# Patient Record
Sex: Female | Born: 1990
Health system: Southern US, Community
[De-identification: ages and names within clinical notes are randomized; demographics above are authoritative.]

## PROBLEM LIST (undated history)

## (undated) DIAGNOSIS — O24419 Gestational diabetes mellitus in pregnancy, unspecified control: Secondary | ICD-10-CM

## (undated) HISTORY — PX: TONSILLECTOMY: SUR1361

---

## 2018-08-05 NOTE — L&D Delivery Note (Signed)
Delivery Note  SVD viable female Apgars 8,9 over 2nd degree ML lac.  Placenta delivered spontaneously intact with 3VC. Repair with 2-0 Chromic with good support and hemostasis noted.  R/V exam confirms.  PH art was sent.   Mother and baby to couplet care and are doing well.  EBL 100cc  Maleta Pacha, MD 

## 2018-09-09 DIAGNOSIS — N911 Secondary amenorrhea: Secondary | ICD-10-CM | POA: Diagnosis not present

## 2018-09-21 DIAGNOSIS — Z319 Encounter for procreative management, unspecified: Secondary | ICD-10-CM | POA: Diagnosis not present

## 2018-09-21 DIAGNOSIS — Z3685 Encounter for antenatal screening for Streptococcus B: Secondary | ICD-10-CM | POA: Diagnosis not present

## 2018-09-21 DIAGNOSIS — Z3481 Encounter for supervision of other normal pregnancy, first trimester: Secondary | ICD-10-CM | POA: Diagnosis not present

## 2018-09-21 LAB — OB RESULTS CONSOLE ANTIBODY SCREEN: Antibody Screen: NEGATIVE

## 2018-09-21 LAB — OB RESULTS CONSOLE RPR: RPR: NONREACTIVE

## 2018-09-21 LAB — OB RESULTS CONSOLE HIV ANTIBODY (ROUTINE TESTING): HIV: NONREACTIVE

## 2018-09-21 LAB — OB RESULTS CONSOLE HEPATITIS B SURFACE ANTIGEN: Hepatitis B Surface Ag: NEGATIVE

## 2018-09-21 LAB — OB RESULTS CONSOLE ABO/RH: RH Type: POSITIVE

## 2018-09-21 LAB — OB RESULTS CONSOLE RUBELLA ANTIBODY, IGM: Rubella: IMMUNE

## 2018-09-29 DIAGNOSIS — Z113 Encounter for screening for infections with a predominantly sexual mode of transmission: Secondary | ICD-10-CM | POA: Diagnosis not present

## 2018-09-29 DIAGNOSIS — D509 Iron deficiency anemia, unspecified: Secondary | ICD-10-CM | POA: Diagnosis not present

## 2018-09-29 DIAGNOSIS — Z3400 Encounter for supervision of normal first pregnancy, unspecified trimester: Secondary | ICD-10-CM | POA: Diagnosis not present

## 2018-09-29 DIAGNOSIS — Z3480 Encounter for supervision of other normal pregnancy, unspecified trimester: Secondary | ICD-10-CM | POA: Diagnosis not present

## 2018-10-14 DIAGNOSIS — Z3682 Encounter for antenatal screening for nuchal translucency: Secondary | ICD-10-CM | POA: Diagnosis not present

## 2018-10-14 DIAGNOSIS — Z3A13 13 weeks gestation of pregnancy: Secondary | ICD-10-CM | POA: Diagnosis not present

## 2018-10-14 DIAGNOSIS — Z3401 Encounter for supervision of normal first pregnancy, first trimester: Secondary | ICD-10-CM | POA: Diagnosis not present

## 2018-11-25 DIAGNOSIS — Z3A19 19 weeks gestation of pregnancy: Secondary | ICD-10-CM | POA: Diagnosis not present

## 2018-11-25 DIAGNOSIS — Z3682 Encounter for antenatal screening for nuchal translucency: Secondary | ICD-10-CM | POA: Diagnosis not present

## 2018-11-25 DIAGNOSIS — Z363 Encounter for antenatal screening for malformations: Secondary | ICD-10-CM | POA: Diagnosis not present

## 2018-11-25 DIAGNOSIS — Z3400 Encounter for supervision of normal first pregnancy, unspecified trimester: Secondary | ICD-10-CM | POA: Diagnosis not present

## 2019-01-22 DIAGNOSIS — Z3400 Encounter for supervision of normal first pregnancy, unspecified trimester: Secondary | ICD-10-CM | POA: Diagnosis not present

## 2019-01-22 DIAGNOSIS — Z3480 Encounter for supervision of other normal pregnancy, unspecified trimester: Secondary | ICD-10-CM | POA: Diagnosis not present

## 2019-01-22 DIAGNOSIS — Z23 Encounter for immunization: Secondary | ICD-10-CM | POA: Diagnosis not present

## 2019-02-01 DIAGNOSIS — O99810 Abnormal glucose complicating pregnancy: Secondary | ICD-10-CM | POA: Diagnosis not present

## 2019-02-18 ENCOUNTER — Other Ambulatory Visit: Payer: Self-pay

## 2019-02-18 ENCOUNTER — Encounter: Payer: Self-pay | Admitting: Registered"

## 2019-02-18 ENCOUNTER — Encounter: Payer: Self-pay | Attending: Obstetrics & Gynecology | Admitting: Registered"

## 2019-02-18 DIAGNOSIS — O99810 Abnormal glucose complicating pregnancy: Secondary | ICD-10-CM | POA: Insufficient documentation

## 2019-02-18 NOTE — Progress Notes (Signed)
Patient was seen on 02/18/2019 for Gestational Diabetes self-management education at the [facility].   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 Information covered, questions answered in 45 min.  The following learning objectives were met by the patient during this course:   States the definition of Gestational Diabetes  States why dietary management is important in controlling blood glucose  Describes the effects each nutrient has on blood glucose levels  Demonstrates ability to create a balanced meal plan  Demonstrates carbohydrate counting   States when to check blood glucose levels  Demonstrates proper blood glucose monitoring techniques  States the effect of stress and exercise on blood glucose levels  States the importance of limiting caffeine and abstaining from alcohol and smoking  Blood glucose monitor given: none (Pt is [employer] Employee and will go through [facility] for monitor)  Patient instructed to monitor glucose levels: FBS: 60 - <95; 1 hour: <140; 2 hour: <120  Patient received handouts:  Nutrition Diabetes and Pregnancy, including carb counting list  Patient will be seen for follow-up as needed.

## 2019-02-19 MED FILL — FREESTYLE LITE METER: 25 days supply | Qty: 1 | Fill #0

## 2019-02-19 MED FILL — FREESTYLE LITE TEST STRIP: 25 days supply | Qty: 100 | Fill #0

## 2019-02-19 MED FILL — FREESTYLE LANCETS: 25 days supply | Qty: 100 | Fill #0

## 2019-03-05 DIAGNOSIS — Z3483 Encounter for supervision of other normal pregnancy, third trimester: Secondary | ICD-10-CM | POA: Diagnosis not present

## 2019-03-05 DIAGNOSIS — Z3482 Encounter for supervision of other normal pregnancy, second trimester: Secondary | ICD-10-CM | POA: Diagnosis not present

## 2019-03-22 DIAGNOSIS — Z3685 Encounter for antenatal screening for Streptococcus B: Secondary | ICD-10-CM | POA: Diagnosis not present

## 2019-03-26 LAB — OB RESULTS CONSOLE GBS: GBS: NEGATIVE

## 2019-04-02 ENCOUNTER — Inpatient Hospital Stay (HOSPITAL_COMMUNITY): Payer: 59 | Admitting: Anesthesiology

## 2019-04-02 ENCOUNTER — Encounter (HOSPITAL_COMMUNITY): Payer: Self-pay | Admitting: Advanced Practice Midwife

## 2019-04-02 ENCOUNTER — Inpatient Hospital Stay (HOSPITAL_COMMUNITY)
Admission: AD | Admit: 2019-04-02 | Discharge: 2019-04-04 | DRG: 807 | Disposition: A | Discharge status: Home / Self Care | Payer: 59 | Attending: Obstetrics and Gynecology | Admitting: Obstetrics and Gynecology

## 2019-04-02 ENCOUNTER — Other Ambulatory Visit: Payer: Self-pay

## 2019-04-02 DIAGNOSIS — O9902 Anemia complicating childbirth: Secondary | ICD-10-CM | POA: Diagnosis present

## 2019-04-02 DIAGNOSIS — O24420 Gestational diabetes mellitus in childbirth, diet controlled: Secondary | ICD-10-CM | POA: Diagnosis present

## 2019-04-02 DIAGNOSIS — Z20828 Contact with and (suspected) exposure to other viral communicable diseases: Secondary | ICD-10-CM | POA: Diagnosis present

## 2019-04-02 DIAGNOSIS — O24429 Gestational diabetes mellitus in childbirth, unspecified control: Secondary | ICD-10-CM | POA: Diagnosis not present

## 2019-04-02 DIAGNOSIS — Z3A37 37 weeks gestation of pregnancy: Secondary | ICD-10-CM

## 2019-04-02 DIAGNOSIS — O4292 Full-term premature rupture of membranes, unspecified as to length of time between rupture and onset of labor: Secondary | ICD-10-CM | POA: Diagnosis present

## 2019-04-02 DIAGNOSIS — D649 Anemia, unspecified: Secondary | ICD-10-CM | POA: Diagnosis present

## 2019-04-02 HISTORY — DX: Gestational diabetes mellitus in pregnancy, unspecified control: O24.419

## 2019-04-02 LAB — TYPE AND SCREEN
ABO/RH(D): A POS
Antibody Screen: NEGATIVE

## 2019-04-02 LAB — CBC
HCT: 33 % — ABNORMAL LOW (ref 36.0–46.0)
Hemoglobin: 10.9 g/dL — ABNORMAL LOW (ref 12.0–15.0)
MCH: 30.2 pg (ref 26.0–34.0)
MCHC: 33 g/dL (ref 30.0–36.0)
MCV: 91.4 fL (ref 80.0–100.0)
Platelets: 241 10*3/uL (ref 150–400)
RBC: 3.61 MIL/uL — ABNORMAL LOW (ref 3.87–5.11)
RDW: 12 % (ref 11.5–15.5)
WBC: 10.6 10*3/uL — ABNORMAL HIGH (ref 4.0–10.5)
nRBC: 0 % (ref 0.0–0.2)

## 2019-04-02 LAB — SARS CORONAVIRUS 2 BY RT PCR (HOSPITAL ORDER, PERFORMED IN ~~LOC~~ HOSPITAL LAB): SARS Coronavirus 2: NEGATIVE

## 2019-04-02 LAB — POCT FERN TEST: POCT Fern Test: POSITIVE

## 2019-04-02 LAB — GLUCOSE, CAPILLARY
Glucose-Capillary: 76 mg/dL (ref 70–99)
Glucose-Capillary: 63 mg/dL — ABNORMAL LOW (ref 70–99)
Glucose-Capillary: 78 mg/dL (ref 70–99)
Glucose-Capillary: 69 mg/dL — ABNORMAL LOW (ref 70–99)

## 2019-04-02 MED ORDER — FLEET ENEMA 7-19 GM/118ML RE ENEM: 1.0000 | ENEMA | Freq: Once | RECTAL | Status: DC

## 2019-04-02 MED ORDER — OXYCODONE-ACETAMINOPHEN 5-325 MG PO TABS: 2.0000 | ORAL_TABLET | ORAL | Status: DC | PRN

## 2019-04-02 MED ORDER — OXYTOCIN BOLUS FROM INFUSION
500.0000 mL | Freq: Once | INTRAVENOUS | Status: AC
  Administered 2019-04-03: 03:00:00 500 mL via INTRAVENOUS

## 2019-04-02 MED ORDER — PHENYLEPHRINE 40 MCG/ML (10ML) SYRINGE FOR IV PUSH (FOR BLOOD PRESSURE SUPPORT): 80.0000 ug | PREFILLED_SYRINGE | INTRAVENOUS | Status: DC | PRN

## 2019-04-02 MED ORDER — SOD CITRATE-CITRIC ACID 500-334 MG/5ML PO SOLN: 30.0000 mL | ORAL | Status: DC | PRN

## 2019-04-02 MED ORDER — EPHEDRINE 5 MG/ML INJ: 10.0000 mg | INTRAVENOUS | Status: DC | PRN

## 2019-04-02 MED ORDER — LIDOCAINE-EPINEPHRINE (PF) 2 %-1:200000 IJ SOLN
INTRAMUSCULAR | Status: DC | PRN
  Administered 2019-04-02 (×2): 3 mL via EPIDURAL
  Administered 2019-04-02: 12 mL via EPIDURAL

## 2019-04-02 MED ORDER — OXYTOCIN 40 UNITS IN NORMAL SALINE INFUSION - SIMPLE MED: 2.5000 [IU]/h | INTRAVENOUS | Status: DC

## 2019-04-02 MED ORDER — DIPHENHYDRAMINE HCL 50 MG/ML IJ SOLN: 12.5000 mg | INTRAMUSCULAR | Status: DC | PRN

## 2019-04-02 MED ORDER — ONDANSETRON HCL 4 MG/2ML IJ SOLN
4.0000 mg | Freq: Four times a day (QID) | INTRAMUSCULAR | Status: DC | PRN
  Administered 2019-04-03: 4 mg via INTRAVENOUS
  Filled 2019-04-02: qty 2

## 2019-04-02 MED ORDER — OXYTOCIN 40 UNITS IN NORMAL SALINE INFUSION - SIMPLE MED
1.0000 m[IU]/min | INTRAVENOUS | Status: DC
  Administered 2019-04-02: 2 m[IU]/min via INTRAVENOUS
  Filled 2019-04-02: qty 1000

## 2019-04-02 MED ORDER — FENTANYL-BUPIVACAINE-NACL 0.5-0.125-0.9 MG/250ML-% EP SOLN
12.0000 mL/h | EPIDURAL | Status: DC | PRN
  Filled 2019-04-02: qty 250

## 2019-04-02 MED ORDER — LACTATED RINGERS IV SOLN
500.0000 mL | Freq: Once | INTRAVENOUS | Status: AC
  Administered 2019-04-02: 16:00:00 500 mL via INTRAVENOUS

## 2019-04-02 MED ORDER — TERBUTALINE SULFATE 1 MG/ML IJ SOLN: 0.2500 mg | Freq: Once | INTRAMUSCULAR | Status: DC | PRN

## 2019-04-02 MED ORDER — LACTATED RINGERS IV SOLN: 500.0000 mL | INTRAVENOUS | Status: DC | PRN

## 2019-04-02 MED ORDER — LIDOCAINE HCL (PF) 1 % IJ SOLN: 30.0000 mL | INTRAMUSCULAR | Status: DC | PRN

## 2019-04-02 MED ORDER — LACTATED RINGERS IV SOLN
INTRAVENOUS | Status: DC
  Administered 2019-04-02 (×2): via INTRAVENOUS

## 2019-04-02 MED ORDER — ACETAMINOPHEN 325 MG PO TABS: 650.0000 mg | ORAL_TABLET | ORAL | Status: DC | PRN

## 2019-04-02 MED ORDER — OXYCODONE-ACETAMINOPHEN 5-325 MG PO TABS: 1.0000 | ORAL_TABLET | ORAL | Status: DC | PRN

## 2019-04-02 NOTE — MAU Note (Addendum)
Pt states her water broke and she called her OB and was told to come in. ([facility]). Water broke at roughly 0800, clear fluid,no odor, still feels baby move normally. At last check weds pt states she was 3cm dialated.

## 2019-04-02 NOTE — MAU Note (Signed)
Covid swab collected.PT asymptomatic. Pt tolerated well

## 2019-04-02 NOTE — Progress Notes (Signed)
From about 1020-1031 pt was up gathering clothing and moving around room. Marilynne Drivers, RN

## 2019-04-02 NOTE — MAU Note (Signed)
Pt states her BS have been about 106 she checks twice daily.

## 2019-04-02 NOTE — Anesthesia Procedure Notes (Signed)
Epidural Patient location during procedure: OB Start time: 04/02/2019 4:00 PM End time: 04/02/2019 4:15 PM  Staffing Anesthesiologist: Freddrick March, MD Performed: anesthesiologist   Preanesthetic Checklist Completed: patient identified, pre-op evaluation, timeout performed, IV checked, risks and benefits discussed and monitors and equipment checked  Epidural Patient position: sitting Prep: site prepped and draped and DuraPrep Patient monitoring: continuous pulse ox, blood pressure, heart rate and cardiac monitor Approach: midline Location: L3-L4 Injection technique: LOR air  Needle:  Needle type: Tuohy  Needle gauge: 17 G Needle length: 9 cm Needle insertion depth: 5 cm Catheter type: closed end flexible Catheter size: 19 Gauge Catheter at skin depth: 10 cm Test dose: negative  Assessment Sensory level: T8 Events: blood not aspirated, injection not painful, no injection resistance, negative IV test and no paresthesia  Additional Notes Patient identified. Risks/Benefits/Options discussed with patient including but not limited to bleeding, infection, nerve damage, paralysis, failed block, incomplete pain control, headache, blood pressure changes, nausea, vomiting, reactions to medication both or allergic, itching and postpartum back pain. Confirmed with bedside nurse the patient's most recent platelet count. Confirmed with patient that they are not currently taking any anticoagulation, have any bleeding history or any family history of bleeding disorders. Patient expressed understanding and wished to proceed. All questions were answered. Sterile technique was used throughout the entire procedure. Please see nursing notes for vital signs. Test dose was given through epidural catheter and negative prior to continuing to dose epidural or start infusion. Warning signs of high block given to the patient including shortness of breath, tingling/numbness in hands, complete motor block,  or any concerning symptoms with instructions to call for help. Patient was given instructions on fall risk and not to get out of bed. All questions and concerns addressed with instructions to call with any issues or inadequate analgesia.  Reason for block:procedure for pain

## 2019-04-02 NOTE — H&P (Signed)
Renee Nelson is a 28 y.o. female presenting for PROM at 0800 clear fluid.  Pregnancy complicated by A2QJ with great control.  GBS-. OB History    Gravida  1   Para      Term      Preterm      AB      Living        SAB      TAB      Ectopic      Multiple      Live Births             Past Medical History:  Diagnosis Date  . Gestational diabetes    managed by diet   Past Surgical History:  Procedure Laterality Date  . TONSILLECTOMY     Family History: family history is not on file. Social History:  reports that she has never smoked. She has never used smokeless tobacco. She reports that she does not drink alcohol or use drugs.     Maternal Diabetes: Yes:  Diabetes Type:  Diet controlled Genetic Screening: Normal Maternal Ultrasounds/Referrals: Normal Fetal Ultrasounds or other Referrals:  None Maternal Substance Abuse:  No Significant Maternal Medications:  None Significant Maternal Lab Results:  Group B Strep negative Other Comments:  None  ROS History Dilation: 3 Effacement (%): 70 Station: -2 Exam by:: Foley,rn Blood pressure 121/69, pulse 86, temperature 98.8 F (37.1 C), temperature source Oral, resp. rate 18, height 5\' 3"  (1.6 m), weight 74.8 kg, SpO2 98 %. Exam Physical Exam  Prenatal labs: ABO, Rh: --/--/A POS, A POS Performed at [facility], [place] [address], [place], [place] [address]  [id number]/28 1024) Antibody: NEG (08/28 1024) Rubella: Immune (02/17 0000) RPR: Nonreactive (02/17 0000)  HBsAg: Negative (02/17 0000)  HIV: Non-reactive (02/17 0000)  GBS: Negative (08/21 0000)   Assessment/Plan: IUP at term Early labor and PROM Augment with Pitocin.  Anticipate SVD A1DM in great control   Luz Lex 04/02/2019, 4:01 PM

## 2019-04-02 NOTE — Anesthesia Preprocedure Evaluation (Signed)
Anesthesia Evaluation  Patient identified by MRN, date of birth, ID band Patient awake    Reviewed: Allergy & Precautions, NPO status , Patient's Chart, lab work & pertinent test results  Airway Mallampati: II  TM Distance: >3 FB Neck ROM: Full    Dental no notable dental hx.    Pulmonary neg pulmonary ROS,    Pulmonary exam normal breath sounds clear to auscultation       Cardiovascular negative cardio ROS Normal cardiovascular exam Rhythm:Regular Rate:Normal     Neuro/Psych negative neurological ROS  negative psych ROS   GI/Hepatic negative GI ROS, Neg liver ROS,   Endo/Other  diabetes (diet controlled), Well Controlled, Gestational  Renal/GU negative Renal ROS  negative genitourinary   Musculoskeletal negative musculoskeletal ROS (+)   Abdominal   Peds  Hematology negative hematology ROS (+) anemia ,   Anesthesia Other Findings   Reproductive/Obstetrics (+) Pregnancy                             Anesthesia Physical Anesthesia Plan  ASA: II  Anesthesia Plan: Epidural   Post-op Pain Management:    Induction:   PONV Risk Score and Plan: Treatment may vary due to age or medical condition  Airway Management Planned: Natural Airway  Additional Equipment:   Intra-op Plan:   Post-operative Plan:   Informed Consent: I have reviewed the patients History and Physical, chart, labs and discussed the procedure including the risks, benefits and alternatives for the proposed anesthesia with the patient or authorized representative who has indicated his/her understanding and acceptance.       Plan Discussed with: Anesthesiologist  Anesthesia Plan Comments: (Patient identified. Risks, benefits, options discussed with patient including but not limited to bleeding, infection, nerve damage, paralysis, failed block, incomplete pain control, headache, blood pressure changes, nausea,  vomiting, reactions to medication, itching, and post partum back pain. Confirmed with bedside nurse the patient's most recent platelet count. Confirmed with the patient that they are not taking any anticoagulation, have any bleeding history or any family history of bleeding disorders. Patient expressed understanding and wishes to proceed. All questions were answered. )        Anesthesia Quick Evaluation

## 2019-04-03 ENCOUNTER — Encounter (HOSPITAL_COMMUNITY): Payer: Self-pay | Admitting: *Deleted

## 2019-04-03 LAB — ABO/RH: ABO/RH(D): A POS

## 2019-04-03 LAB — CBC
HCT: 30.7 % — ABNORMAL LOW (ref 36.0–46.0)
Hemoglobin: 10.1 g/dL — ABNORMAL LOW (ref 12.0–15.0)
MCH: 30.1 pg (ref 26.0–34.0)
MCHC: 32.9 g/dL (ref 30.0–36.0)
MCV: 91.4 fL (ref 80.0–100.0)
Platelets: 233 10*3/uL (ref 150–400)
RBC: 3.36 MIL/uL — ABNORMAL LOW (ref 3.87–5.11)
RDW: 12 % (ref 11.5–15.5)
WBC: 15.3 10*3/uL — ABNORMAL HIGH (ref 4.0–10.5)
nRBC: 0 % (ref 0.0–0.2)

## 2019-04-03 LAB — GLUCOSE, CAPILLARY: Glucose-Capillary: 82 mg/dL (ref 70–99)

## 2019-04-03 LAB — RPR: RPR Ser Ql: NONREACTIVE

## 2019-04-03 MED ORDER — ONDANSETRON HCL 4 MG/2ML IJ SOLN: 4.0000 mg | INTRAMUSCULAR | Status: DC | PRN

## 2019-04-03 MED ORDER — PRENATAL MULTIVITAMIN CH
1.0000 | ORAL_TABLET | Freq: Every day | ORAL | Status: DC
  Administered 2019-04-03 – 2019-04-04 (×2): 1 via ORAL
  Filled 2019-04-03 (×2): qty 1

## 2019-04-03 MED ORDER — OXYCODONE-ACETAMINOPHEN 5-325 MG PO TABS: 2.0000 | ORAL_TABLET | ORAL | Status: DC | PRN

## 2019-04-03 MED ORDER — MEDROXYPROGESTERONE ACETATE 150 MG/ML IM SUSP: 150.0000 mg | INTRAMUSCULAR | Status: DC | PRN

## 2019-04-03 MED ORDER — SIMETHICONE 80 MG PO CHEW: 80.0000 mg | CHEWABLE_TABLET | ORAL | Status: DC | PRN

## 2019-04-03 MED ORDER — DIPHENHYDRAMINE HCL 25 MG PO CAPS: 25.0000 mg | ORAL_CAPSULE | Freq: Four times a day (QID) | ORAL | Status: DC | PRN

## 2019-04-03 MED ORDER — COCONUT OIL OIL: 1.0000 "application " | TOPICAL_OIL | Status: DC | PRN

## 2019-04-03 MED ORDER — DIBUCAINE (PERIANAL) 1 % EX OINT: 1.0000 "application " | TOPICAL_OINTMENT | CUTANEOUS | Status: DC | PRN

## 2019-04-03 MED ORDER — WITCH HAZEL-GLYCERIN EX PADS: 1.0000 "application " | MEDICATED_PAD | CUTANEOUS | Status: DC | PRN

## 2019-04-03 MED ORDER — OXYCODONE-ACETAMINOPHEN 5-325 MG PO TABS: 1.0000 | ORAL_TABLET | ORAL | Status: DC | PRN

## 2019-04-03 MED ORDER — ACETAMINOPHEN 325 MG PO TABS
650.0000 mg | ORAL_TABLET | ORAL | Status: DC | PRN
  Administered 2019-04-03: 19:00:00 650 mg via ORAL
  Filled 2019-04-03: qty 2

## 2019-04-03 MED ORDER — MEASLES, MUMPS & RUBELLA VAC IJ SOLR: 0.5000 mL | Freq: Once | INTRAMUSCULAR | Status: DC

## 2019-04-03 MED ORDER — ZOLPIDEM TARTRATE 5 MG PO TABS: 5.0000 mg | ORAL_TABLET | Freq: Every evening | ORAL | Status: DC | PRN

## 2019-04-03 MED ORDER — SENNOSIDES-DOCUSATE SODIUM 8.6-50 MG PO TABS
2.0000 | ORAL_TABLET | ORAL | Status: DC
  Administered 2019-04-03: 23:00:00 2 via ORAL
  Filled 2019-04-03: qty 2

## 2019-04-03 MED ORDER — ONDANSETRON HCL 4 MG PO TABS: 4.0000 mg | ORAL_TABLET | ORAL | Status: DC | PRN

## 2019-04-03 MED ORDER — TETANUS-DIPHTH-ACELL PERTUSSIS 5-2.5-18.5 LF-MCG/0.5 IM SUSP: 0.5000 mL | Freq: Once | INTRAMUSCULAR | Status: DC

## 2019-04-03 MED ORDER — IBUPROFEN 600 MG PO TABS
600.0000 mg | ORAL_TABLET | Freq: Four times a day (QID) | ORAL | Status: DC
  Administered 2019-04-03 – 2019-04-04 (×5): 600 mg via ORAL
  Filled 2019-04-03 (×5): qty 1

## 2019-04-03 MED ORDER — BENZOCAINE-MENTHOL 20-0.5 % EX AERO
1.0000 "application " | INHALATION_SPRAY | CUTANEOUS | Status: DC | PRN
  Administered 2019-04-03: 1 via TOPICAL
  Filled 2019-04-03: qty 56

## 2019-04-03 NOTE — Progress Notes (Signed)
Post Partum Day 0 Subjective: no complaints, up ad lib, voiding and tolerating PO  Objective: Blood pressure 104/65, pulse 65, temperature 98.5 F (36.9 C), temperature source Oral, resp. rate 18, height 5\' 3"  (1.6 m), weight 74.8 kg, SpO2 100 %, unknown if currently breastfeeding.  Physical Exam:  General: alert, cooperative, appears stated age and no distress Lochia: appropriate Uterine Fundus: firm Incision: healing well DVT Evaluation: No evidence of DVT seen on physical exam.  Recent Labs    04/02/19 1024 04/03/19 0715  HGB 10.9* 10.1*  HCT 33.0* 30.7*    Assessment/Plan: Plan for discharge tomorrow, Breastfeeding and Circumcision prior to discharge   LOS: 1 day   Luz Lex 04/03/2019, 10:24 AM

## 2019-04-03 NOTE — Anesthesia Postprocedure Evaluation (Signed)
Anesthesia Post Note  Patient: Renee Nelson  Procedure(s) Performed: AN AD HOC LABOR EPIDURAL     Patient location during evaluation: Mother Baby Anesthesia Type: Epidural Level of consciousness: awake and alert Pain management: pain level controlled Vital Signs Assessment: post-procedure vital signs reviewed and stable Respiratory status: spontaneous breathing, nonlabored ventilation and respiratory function stable Cardiovascular status: stable Postop Assessment: no headache, no backache and epidural receding Anesthetic complications: no    Last Vitals:  Vitals:   04/03/19 0533 04/03/19 0624  BP: 111/68 104/65  Pulse: 64 65  Resp: 17 18  Temp: 36.7 C 36.9 C  SpO2: 100% 100%    Last Pain:  Vitals:   04/03/19 0625  TempSrc:   PainSc: 5    Pain Goal:                   Rayvon Char

## 2019-04-03 NOTE — Lactation Note (Addendum)
This note was copied from a baby's chart. Lactation Consultation Note  Patient Name: Renee Nelson [id number] Date: 04/03/2019 Reason for consult: Initial assessment;Early term 37-38.6wks;1st time breastfeeding   P1, Baby 43 hours old.  [redacted]w[redacted]d.  RN taught mother how to hand express.  Allowed baby to suck on LC glove finger and then assisted with latching in cross cradle hold helping mother compress her breast to achieve more depth. Baby sustained latch for 15 min with intermittent swallows. Encouraged mother to hand express before latching. Reviewed basics.  Encouraged STS. Set up DEBP.  Feed on demand approximately 8-12 times per day.   Mom made aware of O/P services, breastfeeding support groups, community resources, and our phone # for post-discharge questions.   Plan: 1. Keep baby STS as much as possible  2. Offer breast when baby cues that he/she is hungry, or awaken baby for feeding at 3 hrs. 3.  Breast feed baby, asking for help prn.  Pump both breasts 15-20 minutes on initiation setting q 3 hours if not latching or every other feeding if latching, adding breast massage and hand expression to collect as much colostrum as possible to feed baby.  Spoon/syringe feed back ebm.      Maternal Data Has patient been taught Hand Expression?: Yes Does the patient have breastfeeding experience prior to this delivery?: No  Feeding Feeding Type: Breast Fed  LATCH Score Latch: Repeated attempts needed to sustain latch, nipple held in mouth throughout feeding, stimulation needed to elicit sucking reflex.  Audible Swallowing: A few with stimulation  Type of Nipple: Everted at rest and after stimulation  Comfort (Breast/Nipple): Soft / non-tender  Hold (Positioning): Assistance needed to correctly position infant at breast and maintain latch.  LATCH Score: 7  Interventions Interventions: Breast feeding basics reviewed;Assisted with latch;Skin to skin;Hand express;Breast  compression;Adjust position;Support pillows;Position options  Lactation Tools Discussed/Used     Consult Status Consult Status: Follow-up Date: 04/04/19 Follow-up type: In-patient    Vivianne Master [facility] 04/03/2019, 2:13 PM

## 2019-04-04 NOTE — Discharge Summary (Signed)
Obstetric Discharge Summary Reason for Admission: rupture of membranes Prenatal Procedures: none Intrapartum Procedures: spontaneous vaginal delivery Postpartum Procedures: none Complications-Operative and Postpartum: 2 degree perineal laceration Hemoglobin  Date Value Ref Range Status  04/03/2019 10.1 (L) 12.0 - 15.0 g/dL Final   HCT  Date Value Ref Range Status  04/03/2019 30.7 (L) 36.0 - 46.0 % Final    Physical Exam:  General: alert, cooperative, appears stated age and no distress Lochia: appropriate Uterine Fundus: firm Incision: healing well DVT Evaluation: No evidence of DVT seen on physical exam.  Discharge Diagnoses: Term Pregnancy-delivered  Discharge Information: Date: 04/04/2019 Activity: pelvic rest Diet: routine Medications: PNV Condition: stable Instructions: refer to practice specific booklet Discharge to: home   Newborn Data: Live born female  Birth Weight: 7 lb 9.3 oz (3439 g) APGAR: 8, 9  Newborn Delivery   Birth date/time: 04/03/2019 03:18:00 Delivery type: Vaginal, Spontaneous      Home with mother.  Luz Lex 04/04/2019, 10:00 AM

## 2019-04-09 ENCOUNTER — Ambulatory Visit: Payer: Self-pay

## 2019-04-09 NOTE — Lactation Note (Signed)
This note was copied from a baby's chart. Lactation Consultation Note  Patient Name: Renee Nelson [id number] Date: 04/09/2019    [facility] stopped by to check on mom per [facility] charge RN Maudie Mercury. [facility] services wanted to make sure that she had cleaning supplies for her pump parts. Mom was in the shower, but LC spoke to dad and he said they had everything they need and they're doing well. LC left extra basins, soap and toothbrush brought to the room. Dad aware of [facility] services and will contact PRN.  Maternal Data    Feeding    LATCH Score                   Interventions    Lactation Tools Discussed/Used     Consult Status      Renee Nelson Francene Boyers 04/09/2019, 8:44 PM

## 2019-05-08 DIAGNOSIS — Z20828 Contact with and (suspected) exposure to other viral communicable diseases: Secondary | ICD-10-CM | POA: Diagnosis not present

## 2019-05-08 DIAGNOSIS — R519 Headache, unspecified: Secondary | ICD-10-CM | POA: Diagnosis not present

## 2019-05-08 DIAGNOSIS — J029 Acute pharyngitis, unspecified: Secondary | ICD-10-CM | POA: Diagnosis not present

## 2019-05-08 DIAGNOSIS — Z1159 Encounter for screening for other viral diseases: Secondary | ICD-10-CM | POA: Diagnosis not present

## 2019-05-24 DIAGNOSIS — Z3483 Encounter for supervision of other normal pregnancy, third trimester: Secondary | ICD-10-CM | POA: Diagnosis not present

## 2019-05-24 DIAGNOSIS — Z3482 Encounter for supervision of other normal pregnancy, second trimester: Secondary | ICD-10-CM | POA: Diagnosis not present

## 2019-05-27 DIAGNOSIS — Z1389 Encounter for screening for other disorder: Secondary | ICD-10-CM | POA: Diagnosis not present

## 2019-06-24 MED FILL — metroNIDAZOLE 500 MG TABS: 500 | 10 days supply | Qty: 20 | Fill #0

## 2019-06-28 DIAGNOSIS — Z3483 Encounter for supervision of other normal pregnancy, third trimester: Secondary | ICD-10-CM | POA: Diagnosis not present

## 2019-06-28 DIAGNOSIS — Z3482 Encounter for supervision of other normal pregnancy, second trimester: Secondary | ICD-10-CM | POA: Diagnosis not present

## 2019-07-07 MED FILL — FLUCONAZOLE 100 MG TABLET: 100 | 7 days supply | Qty: 7 | Fill #0

## 2019-07-28 ENCOUNTER — Other Ambulatory Visit: Payer: Self-pay

## 2019-07-29 ENCOUNTER — Ambulatory Visit (INDEPENDENT_AMBULATORY_CARE_PROVIDER_SITE_OTHER): Payer: 59 | Admitting: Internal Medicine

## 2019-07-29 ENCOUNTER — Encounter: Payer: Self-pay | Admitting: Internal Medicine

## 2019-07-29 VITALS — BP 98/60 | HR 78 | Temp 97.7°F | Ht 64.0 in | Wt 139.7 lb

## 2019-07-29 DIAGNOSIS — Z3483 Encounter for supervision of other normal pregnancy, third trimester: Secondary | ICD-10-CM | POA: Diagnosis not present

## 2019-07-29 DIAGNOSIS — Z3482 Encounter for supervision of other normal pregnancy, second trimester: Secondary | ICD-10-CM | POA: Diagnosis not present

## 2019-07-29 DIAGNOSIS — Z0000 Encounter for general adult medical examination without abnormal findings: Secondary | ICD-10-CM | POA: Diagnosis not present

## 2019-07-29 NOTE — Progress Notes (Signed)
Established Patient Office Visit     This visit occurred during the SARS-CoV-2 public health emergency.  Safety protocols were in place, including screening questions prior to the visit, additional usage of staff PPE, and extensive cleaning of exam room while observing appropriate contact time as indicated for disinfecting solutions.    CC/Reason for Visit: Establish care, annual preventive exam  HPI: Renee Nelson is a 28 y.o. female who is coming in today for the above mentioned reasons.  She has no past medical history of significance other than gestational diabetes, her son is now 42 months old.  She works as a Engineer, civil (consulting) in the [employer].  She has no acute complaints today.  She does not smoke, she does not drink, she takes no medications either prescription or over-the-counter, her past surgical history is only significant for tonsillectomy and adenoidectomy in childhood.  She has allergies to penicillins which cause a rash, history significant for paternal grandmother with coronary artery disease and a paternal grandfather with diabetes.  Past Medical/Surgical History: Past Medical History:  Diagnosis Date  . Gestational diabetes    managed by diet    Past Surgical History:  Procedure Laterality Date  . TONSILLECTOMY      Social History:  reports that she has never smoked. She has never used smokeless tobacco. She reports that she does not drink alcohol or use drugs.  Allergies: Allergies  Allergen Reactions  . Penicillins Rash    Family History:  Family History  Problem Relation Age of Onset  . CAD Paternal Grandmother   . Diabetes Mellitus II Paternal Grandfather      Current Outpatient Medications:  .  Prenatal Vit-Fe Fumarate-FA (PRENATAL MULTIVITAMIN) TABS tablet, Take 1 tablet by mouth daily at 12 noon., Disp: , Rfl:   Review of Systems:  Constitutional: Denies fever, chills, diaphoresis, appetite change and fatigue.  HEENT:  Denies photophobia, eye pain, redness, hearing loss, ear pain, congestion, sore throat, rhinorrhea, sneezing, mouth sores, trouble swallowing, neck pain, neck stiffness and tinnitus.   Respiratory: Denies SOB, DOE, cough, chest tightness,  and wheezing.   Cardiovascular: Denies chest pain, palpitations and leg swelling.  Gastrointestinal: Denies nausea, vomiting, abdominal pain, diarrhea, constipation, blood in stool and abdominal distention.  Genitourinary: Denies dysuria, urgency, frequency, hematuria, flank pain and difficulty urinating.  Endocrine: Denies: hot or cold intolerance, sweats, changes in hair or nails, polyuria, polydipsia. Musculoskeletal: Denies myalgias, back pain, joint swelling, arthralgias and gait problem.  Skin: Denies pallor, rash and wound.  Neurological: Denies dizziness, seizures, syncope, weakness, light-headedness, numbness and headaches.  Hematological: Denies adenopathy. Easy bruising, personal or family bleeding history  Psychiatric/Behavioral: Denies suicidal ideation, mood changes, confusion, nervousness, sleep disturbance and agitation    Physical Exam: Vitals:   07/29/19 0936  BP: 98/60  Pulse: 78  Temp: 97.7 F (36.5 C)  TempSrc: Temporal  SpO2: 97%  Weight: 139 lb 11.2 oz (63.4 kg)  Height: 5\' 4"  (1.626 m)    Body mass index is 23.98 kg/m.   Constitutional: NAD, calm, comfortable Eyes: PERRL, lids and conjunctivae normal ENMT: Mucous membranes are moist. Tympanic membrane is pearly white, no erythema or bulging. Neck: normal, supple, no masses, no thyromegaly Respiratory: clear to auscultation bilaterally, no wheezing, no crackles. Normal respiratory effort. No accessory muscle use.  Cardiovascular: Regular rate and rhythm, no murmurs / rubs / gallops. No extremity edema. 2+ pedal pulses. Abdomen: no tenderness, no masses palpated. No hepatosplenomegaly. Bowel sounds positive.  Musculoskeletal: no clubbing / cyanosis. No joint deformity  upper and lower extremities. Good ROM, no contractures. Normal muscle tone.  Skin: no rashes, lesions, ulcers. No induration Neurologic: CN 2-12 grossly intact. Sensation intact, DTR normal. Strength 5/5 in all 4.  Psychiatric: Normal judgment and insight. Alert and oriented x 3. Normal mood.    Impression and Plan:  Encounter for preventive health examination -She has routine dental care, have advised routine eye care. -Immunizations are up-to-date and age-appropriate. -No labs today given age. -Healthy lifestyle discussed in detail. -Commence routine colon cancer screening at age 65, routine breast cancer screening at age 24, has a GYN who does pelvic exams and Pap smears.       Lelon Frohlich, MD [facility]

## 2019-08-27 DIAGNOSIS — Z3483 Encounter for supervision of other normal pregnancy, third trimester: Secondary | ICD-10-CM | POA: Diagnosis not present

## 2019-08-27 DIAGNOSIS — Z3482 Encounter for supervision of other normal pregnancy, second trimester: Secondary | ICD-10-CM | POA: Diagnosis not present

## 2019-12-05 ENCOUNTER — Encounter: Payer: Self-pay | Admitting: Physician Assistant

## 2019-12-05 ENCOUNTER — Telehealth: Payer: 59 | Admitting: Physician Assistant

## 2019-12-05 DIAGNOSIS — H60391 Other infective otitis externa, right ear: Secondary | ICD-10-CM | POA: Diagnosis not present

## 2019-12-05 DIAGNOSIS — R0981 Nasal congestion: Secondary | ICD-10-CM

## 2019-12-05 DIAGNOSIS — J3089 Other allergic rhinitis: Secondary | ICD-10-CM | POA: Diagnosis not present

## 2019-12-05 MED ORDER — CETIRIZINE HCL 10 MG PO TABS: 10.0000 mg | ORAL_TABLET | Freq: Every day | ORAL | 0 refills | Status: DC

## 2019-12-05 MED ORDER — FLUTICASONE PROPIONATE 50 MCG/ACT NA SUSP: 2.0000 | Freq: Every day | NASAL | 6 refills | Status: DC

## 2019-12-05 MED ORDER — NEOMYCIN-POLYMYXIN-HC 1 % OT SOLN: 3.0000 [drp] | Freq: Four times a day (QID) | OTIC | 0 refills | Status: DC

## 2019-12-05 NOTE — Progress Notes (Signed)
  E Visit for Swimmer's Ear  We are sorry that you are not feeling well. Here is how we plan to help!  I have prescribed: Neomycin 0.35%, polymyxin B 10,000 units/mL, and hydrocortisone 0,5% otic solution 4 drops in affected ears four times a day for 7 days I have also prescribed Flonase, use 1 spray in each nostril once daily. This is will help with nasal congestion and other nasal symptoms.   I have also prescribed an allergy pill, Zyrtec 10 mg, take one pill daily. This will help with nasal symptoms and any underlying allergies.   In certain cases swimmer's ear may progress to a more serious bacterial infection of the middle or inner ear.  If you have a fever 102 and up and significantly worsening symptoms, this could indicate a more serious infection moving to the middle/inner and needs face to face evaluation in an office by a provider.  Your symptoms should improve over the next 3 days and should resolve in about 7 days.  HOME CARE:   Wash your hands frequently.  Do not place the tip of the bottle on your ear or touch it with your fingers.  You can take Acetominophen 650 mg every 4-6 hours as needed for pain.  If pain is severe or moderate, you can apply a heating pad (set on low) or hot water bottle (wrapped in a towel) to outer ear for 20 minutes.  This will also increase drainage.  Avoid ear plugs  Do not use Q-tips  After showers, help the water run out by tilting your head to one side.  GET HELP RIGHT AWAY IF:   Fever is over 102.2 degrees.  You develop progressive ear pain or hearing loss.  Ear symptoms persist longer than 3 days after treatment.  MAKE SURE YOU:   Understand these instructions.  Will watch your condition.  Will get help right away if you are not doing well or get worse.  TO PREVENT SWIMMER'S EAR:  Use a bathing cap or custom fitted swim molds to keep your ears dry.  Towel off after swimming to dry your ears.  Tilt your head or pull  your earlobes to allow the water to escape your ear canal.  If there is still water in your ears, consider using a hairdryer on the lowest setting.  Thank you for choosing an e-visit. Your e-visit answers were reviewed by a board certified advanced clinical practitioner to complete your personal care plan. Depending upon the condition, your plan could have included both over the counter or prescription medications. Please review your pharmacy choice. Be sure that the pharmacy you have chosen is open so that you can pick up your prescription now.  If there is a problem you may message your provider in MyChart to have the prescription routed to another pharmacy. Your safety is important to [place]. If you have drug allergies check your prescription carefully.  For the next 24 hours, you can use MyChart to ask questions about today's visit, request a non-urgent call back, or ask for a work or school excuse from your e-visit provider. You will get an email in the next two days asking about your experience. I hope that your e-visit has been valuable and will speed your recovery.     I spent 5-10 minutes on review and completion of this note- Illa Level [facility]

## 2019-12-06 MED FILL — FLUTICASONE PROP 50 MCG SPR: 50 | 30 days supply | Qty: 16 | Fill #0

## 2019-12-06 MED FILL — NEO/POLYMYXIN/HC EAR SOLN: 3.5-10000-1 | 17 days supply | Qty: 10 | Fill #0

## 2020-01-10 DIAGNOSIS — Z20822 Contact with and (suspected) exposure to covid-19: Secondary | ICD-10-CM | POA: Diagnosis not present

## 2020-03-17 ENCOUNTER — Telehealth: Payer: 59 | Admitting: Nurse Practitioner

## 2020-03-17 DIAGNOSIS — B9689 Other specified bacterial agents as the cause of diseases classified elsewhere: Secondary | ICD-10-CM

## 2020-03-17 DIAGNOSIS — J019 Acute sinusitis, unspecified: Secondary | ICD-10-CM | POA: Diagnosis not present

## 2020-03-17 MED ORDER — AZITHROMYCIN 250 MG PO TABS: ORAL_TABLET | ORAL | 0 refills | Status: AC

## 2020-03-17 MED FILL — AZITHROMYCIN 250 MG TABS: 250 | 5 days supply | Qty: 6 | Fill #0

## 2020-03-17 NOTE — Progress Notes (Signed)
We are sorry that you are not feeling well.  Here is how we plan to help!  Based on what you have shared with me it looks like you have sinusitis.  Sinusitis is inflammation and infection in the sinus cavities of the head.  Based on your presentation I believe you most likely have Acute Bacterial Sinusitis.  This is an infection caused by bacteria and is treated with antibiotics. I have prescribed Azithromycin for 5 days. You may use an oral decongestant such as Mucinex D or if you have glaucoma or high blood pressure use plain Mucinex. Saline nasal spray help and can safely be used as often as needed for congestion.  If you develop worsening sinus pain, fever or notice severe headache and vision changes, or if symptoms are not better after completion of antibiotic, please schedule an appointment with a health care provider.    Sinus infections are not as easily transmitted as other respiratory infection, however we still recommend that you avoid close contact with loved ones, especially the very [place] and elderly.  Remember to wash your hands thoroughly throughout the day as this is the number one way to prevent the spread of infection!  Home Care:  Only take medications as instructed by your medical team.  Complete the entire course of an antibiotic.  Do not take these medications with alcohol.  A steam or ultrasonic humidifier can help congestion.  You can place a towel over your head and breathe in the steam from hot water coming from a faucet.  Avoid close contacts especially the very [place] and the elderly.  Cover your mouth when you cough or sneeze.  Always remember to wash your hands.  Get Help Right Away If:  You develop worsening fever or sinus pain.  You develop a severe head ache or visual changes.  Your symptoms persist after you have completed your treatment plan.  Make sure you  Understand these instructions.  Will watch your condition.  Will get help right away if  you are not doing well or get worse.  Your e-visit answers were reviewed by a board certified advanced clinical practitioner to complete your personal care plan.  Depending on the condition, your plan could have included both over the counter or prescription medications.  If there is a problem please reply  once you have received a response from your provider.  Your safety is important to [place].  If you have drug allergies check your prescription carefully.    You can use MyChart to ask questions about today's visit, request a non-urgent call back, or ask for a work or school excuse for 24 hours related to this e-Visit. If it has been greater than 24 hours you will need to follow up with your provider, or enter a new e-Visit to address those concerns.  You will get an e-mail in the next two days asking about your experience.  I hope that your e-visit has been valuable and will speed your recovery. Thank you for using e-visits.   I have spent at least 5 minutes reviewing and documenting in the patient's chart.

## 2020-04-12 ENCOUNTER — Other Ambulatory Visit: Payer: Self-pay

## 2020-04-13 ENCOUNTER — Encounter: Payer: Self-pay | Admitting: Internal Medicine

## 2020-04-13 ENCOUNTER — Ambulatory Visit: Payer: 59 | Admitting: Internal Medicine

## 2020-04-13 VITALS — BP 102/64 | HR 77 | Temp 98.0°F | Wt 139.9 lb

## 2020-04-13 DIAGNOSIS — F418 Other specified anxiety disorders: Secondary | ICD-10-CM

## 2020-04-13 NOTE — Progress Notes (Signed)
Established Patient Office Visit     This visit occurred during the SARS-CoV-2 public health emergency.  Safety protocols were in place, including screening questions prior to the visit, additional usage of staff PPE, and extensive cleaning of exam room while observing appropriate contact time as indicated for disinfecting solutions.    CC/Reason for Visit: anxiety  HPI: Renee Nelson is a 29 y.o. female who is coming in today for the above mentioned reasons. 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 She has been having severe anxiety surrounding the COVID pandemic in general but especially how it might affect her son. She is still able to function well at work and in her personal life. But she is having some physical manifestations of anxiety: bouts of crying, palpitations and some mild chest discomfort. She has no risk factors for CAD.   Past Medical/Surgical History: Past Medical History:  Diagnosis Date  . Gestational diabetes    managed by diet    Past Surgical History:  Procedure Laterality Date  . TONSILLECTOMY      Social History:  reports that she has never smoked. She has never used smokeless tobacco. She reports that she does not drink alcohol and does not use drugs.  Allergies: Allergies  Allergen Reactions  . Penicillins Rash    Family History:  Family History  Problem Relation Age of Onset  . CAD Paternal Grandmother   . Diabetes Mellitus II Paternal Grandfather      Current Outpatient Medications:  .  Prenatal Vit-Fe Fumarate-FA (PRENATAL MULTIVITAMIN) TABS tablet, Take 1 tablet by mouth daily at 12 noon., Disp: , Rfl:   Review of Systems:  Constitutional: Denies fever, chills, diaphoresis, appetite change and fatigue.  HEENT: Denies photophobia, eye pain, redness, hearing loss, ear pain, congestion, sore throat, rhinorrhea, sneezing, mouth sores, trouble swallowing, neck pain, neck stiffness and tinnitus.   Respiratory:  Denies SOB, DOE, cough,  and wheezing.   Cardiovascular: Denies , palpitations and leg swelling.  Gastrointestinal: Denies nausea, vomiting, abdominal pain, diarrhea, constipation, blood in stool and abdominal distention.  Genitourinary: Denies dysuria, urgency, frequency, hematuria, flank pain and difficulty urinating.  Endocrine: Denies: hot or cold intolerance, sweats, changes in hair or nails, polyuria, polydipsia. Musculoskeletal: Denies myalgias, back pain, joint swelling, arthralgias and gait problem.  Skin: Denies pallor, rash and wound.  Neurological: Denies dizziness, seizures, syncope, weakness, light-headedness, numbness and headaches.  Hematological: Denies adenopathy. Easy bruising, personal or family bleeding history  Psychiatric/Behavioral: Denies suicidal ideation, mood changes, confusion, sleep disturbance and agitation    Physical Exam: Vitals:   04/13/20 1141  BP: 102/64  Pulse: 77  Temp: 98 F (36.7 C)  TempSrc: Oral  SpO2: 94%  Weight: 139 lb 14.4 oz (63.5 kg)    Body mass index is 24.01 kg/m.   Constitutional: NAD, calm, comfortable Eyes: PERRL, lids and conjunctivae normal ENMT: Mucous membranes are moist.  Respiratory: clear to auscultation bilaterally, no wheezing, no crackles. Normal respiratory effort. No accessory muscle use.  Cardiovascular: Regular rate and rhythm, no murmurs / rubs / gallops. No extremity edema.  Abdomen: no tenderness, no masses palpated. No hepatosplenomegaly. Bowel sounds positive.  Psychiatric: Normal judgment and insight. Alert and oriented x 3. Tearful at times   Impression and Plan:  Situational anxiety  -She will go thru [employer] [facility] employee assistance to find a licensed counselor for CBT sessions. -She will reach out to me afterwards if she still needs helps  to consider low dose SSRI like citalopram.   Time Spent: 30 mins   Calypso Hagarty Philip Aspen, MD [facility]

## 2020-05-02 ENCOUNTER — Telehealth: Payer: 59 | Admitting: Physician Assistant

## 2020-05-02 DIAGNOSIS — J029 Acute pharyngitis, unspecified: Secondary | ICD-10-CM

## 2020-05-02 MED ORDER — CLOTRIMAZOLE 10 MG MT TROC: 10.0000 mg | Freq: Every day | OROMUCOSAL | 0 refills | Status: DC

## 2020-05-02 MED ORDER — CLOTRIMAZOLE 10 MG MT TROC: 10.0000 mg | Freq: Every day | OROMUCOSAL | 0 refills | Status: AC

## 2020-05-02 NOTE — Addendum Note (Signed)
Addended by: Michela Pitcher A on: 05/02/2020 11:04 AM   Modules accepted: Orders

## 2020-05-02 NOTE — Progress Notes (Signed)
We are sorry that you are not feeling well.  Here is how we plan to help!  Your symptoms indicate Pharyngitis, likely due to thrush (a fungal infection) in your case.   Pharyngitis is inflammation in the back of the throat which can cause a sore throat, scratchiness and sometimes difficulty swallowing.     I have prescribed clotrimazole troches, which should be safe in breastfeeding. Let this dissolve slowly in the mouth over the course of a few minutes, 5 times daily for 10 consecutive days.   Pharyngitis is typically caused by a respiratory virus and will just run its course.  Please keep in mind that your symptoms could last up to 10 days.  For throat pain, we recommend over the counter oral pain relief medications such as acetaminophen or aspirin, or anti-inflammatory medications such as ibuprofen or naproxen sodium.    Avoid close contact with loved ones, especially the very [place] and elderly.  Remember to wash your hands thoroughly throughout the day as this is the number one way to prevent the spread of infection and wipe down door knobs and counters with disinfectant.  After careful review of your answers, I would not recommend and antibiotic for your condition.  Antibiotics should not be used to treat conditions that we suspect are caused by viruses like the virus that causes the common cold or flu. However, some people can have Strep with atypical symptoms. You may need formal testing in clinic or office to confirm if your symptoms continue or worsen.  Providers prescribe antibiotics to treat infections caused by bacteria. Antibiotics are very powerful in treating bacterial infections when they are used properly.  To maintain their effectiveness, they should be used only when necessary.  Overuse of antibiotics has resulted in the development of super bugs that are resistant to treatment!    Home Care:  Only take medications as instructed by your medical team.  Do not drink alcohol while  taking these medications.  A steam or ultrasonic humidifier can help congestion.  You can place a towel over your head and breathe in the steam from hot water coming from a faucet.  Avoid close contacts especially the very [place] and the elderly.  Cover your mouth when you cough or sneeze.  Always remember to wash your hands.  Get Help Right Away If:  You develop worsening fever or throat pain.  You develop a severe head ache or visual changes.  Your symptoms persist after you have completed your treatment plan.  Make sure you  Understand these instructions.  Will watch your condition.  Will get help right away if you are not doing well or get worse.  Your e-visit answers were reviewed by a board certified advanced clinical practitioner to complete your personal care plan.  Depending on the condition, your plan could have included both over the counter or prescription medications.  If there is a problem please reply  once you have received a response from your provider.  Your safety is important to [place].  If you have drug allergies check your prescription carefully.    You can use MyChart to ask questions about todays visit, request a non-urgent call back, or ask for a work or school excuse for 24 hours related to this e-Visit. If it has been greater than 24 hours you will need to follow up with your provider, or enter a new e-Visit to address those concerns.  You will get an e-mail in the next two days  asking about your experience.  I hope that your e-visit has been valuable and will speed your recovery. Thank you for using e-visits.  Greater than 5 minutes, yet less than 10 minutes of time have been spent researching, coordinating, and implementing care for this patient today.

## 2020-07-14 DIAGNOSIS — Z01419 Encounter for gynecological examination (general) (routine) without abnormal findings: Secondary | ICD-10-CM | POA: Diagnosis not present

## 2020-07-14 DIAGNOSIS — Z6825 Body mass index (BMI) 25.0-25.9, adult: Secondary | ICD-10-CM | POA: Diagnosis not present

## 2020-08-05 NOTE — L&D Delivery Note (Addendum)
Delivery Note At 8:16 AM a viable female was delivered via Vaginal, Spontaneous (Presentation: Right Occiput Anterior).  APGAR: 9, 9; weight  .   Placenta status: Spontaneous, Intact.  Cord: 3 vessels with the following complications: None.  Cord pH: not sent   Anesthesia: Epidural Episiotomy: None Lacerations: 1st degree Suture Repair: 3.0 vicryl Est. Blood Loss (mL): 200   One dose of TXA given s/s uterine atony despite massage. Bleeding was stable but uterus was completley atonic. Firmed up thereafter.  It's a boy - "[name]"!!    Mom to postpartum.  Baby to Couplet care / Skin to Skin.  Renee Nelson 07/25/2021, 9:22 AM

## 2020-08-19 ENCOUNTER — Telehealth: Payer: 59 | Admitting: Nurse Practitioner

## 2020-08-19 DIAGNOSIS — J0101 Acute recurrent maxillary sinusitis: Secondary | ICD-10-CM

## 2020-08-19 MED ORDER — DOXYCYCLINE HYCLATE 100 MG PO TABS: 100.0000 mg | ORAL_TABLET | Freq: Two times a day (BID) | ORAL | 0 refills | Status: DC

## 2020-08-19 NOTE — Progress Notes (Signed)
We are sorry that you are not feeling well.  Here is how we plan to help!  Based on what you have shared with me it looks like you have sinusitis.  Sinusitis is inflammation and infection in the sinus cavities of the head.  Based on your presentation I believe you most likely have Acute Bacterial Sinusitis.  This is an infection caused by bacteria and is treated with antibiotics. I have prescribed Doxycycline 100mg by mouth twice a day for 10 days. You may use an oral decongestant such as Mucinex D or if you have glaucoma or high blood pressure use plain Mucinex. Saline nasal spray help and can safely be used as often as needed for congestion.  If you develop worsening sinus pain, fever or notice severe headache and vision changes, or if symptoms are not better after completion of antibiotic, please schedule an appointment with a health care provider.    Sinus infections are not as easily transmitted as other respiratory infection, however we still recommend that you avoid close contact with loved ones, especially the very [place] and elderly.  Remember to wash your hands thoroughly throughout the day as this is the number one way to prevent the spread of infection!  Home Care:  Only take medications as instructed by your medical team.  Complete the entire course of an antibiotic.  Do not take these medications with alcohol.  A steam or ultrasonic humidifier can help congestion.  You can place a towel over your head and breathe in the steam from hot water coming from a faucet.  Avoid close contacts especially the very [place] and the elderly.  Cover your mouth when you cough or sneeze.  Always remember to wash your hands.  Get Help Right Away If:  You develop worsening fever or sinus pain.  You develop a severe head ache or visual changes.  Your symptoms persist after you have completed your treatment plan.  Make sure you  Understand these instructions.  Will watch your  condition.  Will get help right away if you are not doing well or get worse.  Your e-visit answers were reviewed by a board certified advanced clinical practitioner to complete your personal care plan.  Depending on the condition, your plan could have included both over the counter or prescription medications.  If there is a problem please reply  once you have received a response from your provider.  Your safety is important to us.  If you have drug allergies check your prescription carefully.    You can use MyChart to ask questions about today's visit, request a non-urgent call back, or ask for a work or school excuse for 24 hours related to this e-Visit. If it has been greater than 24 hours you will need to follow up with your provider, or enter a new e-Visit to address those concerns.  You will get an e-mail in the next two days asking about your experience.  I hope that your e-visit has been valuable and will speed your recovery. Thank you for using e-visits.  5-10 minutes spent reviewing and documenting in chart.  

## 2020-08-29 ENCOUNTER — Telehealth: Payer: 59 | Admitting: Emergency Medicine

## 2020-08-29 DIAGNOSIS — U071 COVID-19: Secondary | ICD-10-CM | POA: Diagnosis not present

## 2020-08-29 DIAGNOSIS — R059 Cough, unspecified: Secondary | ICD-10-CM | POA: Diagnosis not present

## 2020-08-29 MED ORDER — BENZONATATE 100 MG PO CAPS: 100.0000 mg | ORAL_CAPSULE | Freq: Two times a day (BID) | ORAL | 0 refills | Status: DC | PRN

## 2020-08-29 NOTE — Progress Notes (Signed)
E-Visit for Corona Virus Screening  We are sorry you are not feeling well. We are here to help!  It's hard to say if being sick before is making your condition any worse.  Your symptoms sound to be about par for the course for many people that have COVID.  The one thing to consider is, is if you are getting very short of breath, you should probably be seen in person and have a chest x-ray along with having your oxygen level tested.  Regarding the cough, it can last for months in some cases.  I'll try sending a prescription for some tessalon perles as outlined below.  Other than that, there isn't much that we can do. You fall into the low risk category, so I don't think that you'd qualify for monoclonal antibody therapy.  See below for more general COVID information.  You have tested positive for COVID-19, meaning that you were infected with the novel coronavirus and could give the virus to others.  It is vitally important that you stay home so you do not spread it to others.      Please continue isolation at home, for at least 10 days since the start of your symptoms and until you have had 24 hours with no fever (without taking a fever reducer) and with improving of symptoms.  If you have no symptoms but tested positive (or all symptoms resolve after 5 days and you have no fever) you can leave your house but continue to wear a mask around others for an additional 5 days. If you have a fever,continue to stay home until you have had 24 hours of no fever. Most cases improve 5-10 days from onset but we have seen a small number of patients who have gotten worse after the 10 days.  Please be sure to watch for worsening symptoms and remain taking the proper precautions.   Go to the nearest hospital ED for assessment if fever/cough/breathlessness are severe or illness seems like a threat to life.    The following symptoms may appear 2-14 days after exposure: . Fever . Cough . Shortness of breath or  difficulty breathing . Chills . Repeated shaking with chills . Muscle pain . Headache . Sore throat . New loss of taste or smell . Fatigue . Congestion or runny nose . Nausea or vomiting . Diarrhea  You have been enrolled in [facility] Monitoring for COVID-19. Daily you will receive a questionnaire within the MyChart website. Our COVID-19 response team will be monitoring your responses daily.  You can use medication such as A prescription cough medication called Tessalon Perles 100 mg. You may take 1-2 capsules every 8 hours as needed for cough  You have tested positive for Covid but because you are not considered high risk you do not qualify for monoclonal antibody infusion.  Supportive care is all that is needed.   You may also take acetaminophen (Tylenol) as needed for fever.  HOME CARE: . Only take medications as instructed by your medical team. . Drink plenty of fluids and get plenty of rest. . A steam or ultrasonic humidifier can help if you have congestion.   GET HELP RIGHT AWAY IF YOU HAVE EMERGENCY WARNING SIGNS.  Call 911 or proceed to your closest emergency facility if: . You develop worsening high fever. . Trouble breathing . Bluish lips or face . Persistent pain or pressure in the chest . New confusion . Inability to wake or stay awake . You cough  up blood. . Your symptoms become more severe . Inability to hold down food or fluids  This list is not all possible symptoms. Contact your medical provider for any symptoms that are severe or concerning to you.    Your e-visit answers were reviewed by a board certified advanced clinical practitioner to complete your personal care plan.  Depending on the condition, your plan could have included both over the counter or prescription medications.  If there is a problem please reply once you have received a response from your provider.  Your safety is important to [place].  If you have drug allergies check your prescription  carefully.    You can use MyChart to ask questions about today's visit, request a non-urgent call back, or ask for a work or school excuse for 24 hours related to this e-Visit. If it has been greater than 24 hours you will need to follow up with your provider, or enter a new e-Visit to address those concerns. You will get an e-mail in the next two days asking about your experience.  I hope that your e-visit has been valuable and will speed your recovery. Thank you for using e-visits.      Approximately 5 minutes was used in reviewing the patient's chart, questionnaire, prescribing medications, and documentation.

## 2020-11-10 DIAGNOSIS — H11153 Pinguecula, bilateral: Secondary | ICD-10-CM | POA: Diagnosis not present

## 2020-12-11 ENCOUNTER — Encounter: Payer: Self-pay | Admitting: Internal Medicine

## 2020-12-18 DIAGNOSIS — N911 Secondary amenorrhea: Secondary | ICD-10-CM | POA: Diagnosis not present

## 2020-12-20 ENCOUNTER — Other Ambulatory Visit (HOSPITAL_COMMUNITY): Payer: Self-pay

## 2020-12-20 DIAGNOSIS — Z3481 Encounter for supervision of other normal pregnancy, first trimester: Secondary | ICD-10-CM | POA: Diagnosis not present

## 2020-12-20 DIAGNOSIS — Z3685 Encounter for antenatal screening for Streptococcus B: Secondary | ICD-10-CM | POA: Diagnosis not present

## 2020-12-20 LAB — OB RESULTS CONSOLE RPR: RPR: NONREACTIVE

## 2020-12-20 LAB — OB RESULTS CONSOLE HIV ANTIBODY (ROUTINE TESTING): HIV: NONREACTIVE

## 2020-12-20 LAB — OB RESULTS CONSOLE HEPATITIS B SURFACE ANTIGEN: Hepatitis B Surface Ag: NEGATIVE

## 2020-12-20 LAB — OB RESULTS CONSOLE RUBELLA ANTIBODY, IGM: Rubella: IMMUNE

## 2020-12-20 MED ORDER — DOXYLAMINE-PYRIDOXINE 10-10 MG PO TBEC
2.0000 | DELAYED_RELEASE_TABLET | Freq: Every day | ORAL | 0 refills | Status: DC
  Filled 2020-12-20: qty 60, 30d supply, fill #0

## 2020-12-25 ENCOUNTER — Other Ambulatory Visit (HOSPITAL_COMMUNITY): Payer: Self-pay

## 2021-01-05 DIAGNOSIS — Z113 Encounter for screening for infections with a predominantly sexual mode of transmission: Secondary | ICD-10-CM | POA: Diagnosis not present

## 2021-01-05 DIAGNOSIS — Z3400 Encounter for supervision of normal first pregnancy, unspecified trimester: Secondary | ICD-10-CM | POA: Diagnosis not present

## 2021-01-08 LAB — OB RESULTS CONSOLE GC/CHLAMYDIA
Chlamydia: NEGATIVE
Gonorrhea: NEGATIVE

## 2021-01-15 ENCOUNTER — Other Ambulatory Visit (HOSPITAL_COMMUNITY): Payer: Self-pay

## 2021-01-15 ENCOUNTER — Telehealth: Payer: 59 | Admitting: Physician Assistant

## 2021-01-15 DIAGNOSIS — J019 Acute sinusitis, unspecified: Secondary | ICD-10-CM

## 2021-01-15 DIAGNOSIS — Z3A12 12 weeks gestation of pregnancy: Secondary | ICD-10-CM

## 2021-01-15 MED ORDER — AZITHROMYCIN 250 MG PO TABS
ORAL_TABLET | ORAL | 0 refills | Status: DC
  Filled 2021-01-15: qty 6, 5d supply, fill #0

## 2021-01-15 NOTE — Progress Notes (Signed)
For the safety of you and your child, I recommend a face to face office visit with a health care provider.  Many mothers need to take medicines during their pregnancy and while nursing.  Almost all medicines pass into the breast milk in small quantities.  Most are generally considered safe for a mother to take but some medicines must be avoided.  After reviewing your E-Visit request, I recommend that you consult your OB/GYN or pediatrician for medical advice in relation to your condition and prescription medications while pregnant or breastfeeding. NOTE: If you entered your credit card information for this eVisit, you will not be charged. You may see a "hold" on your card for the $35 but that hold will drop off and you will not have a charge processed.  If you are having a true medical emergency please call 911.    For an urgent face to face visit, [facility] has six urgent care centers for your convenience:     [facility] [place] Get Driving Directions [phone] [address] [place], [place] [address] . 8 am - 4 pm Monday - Friday    [facility] ([place]) Get Driving Directions [phone] [address] [place], [place] [address] . 8 am to 8 pm Monday-Friday . 10 am to 6 pm Saturday-Sunday  [facility] ([place]) Get Driving Directions [phone]  [address] [place],  [place]  [address] . 8 am to 8 pm Monday-Friday . 8 am to 4 pm Saturday-Sunday  [facility] Get Driving Directions [phone] [address], [address] [place], [place] [address] . 8 am to 8 pm Monday-Friday . 8 am to 4 pm Saturday-Sunday   [facility] [place] Get Driving Directions  [phone] [address] [place], [place] [address] . 8 am to 8 pm Monday-Friday . 8 am to 4 pm Saturday-Sunday     [facility] [place] Get Driving Directions [phone] [address] [place],  [address] . 8 am to 8 pm Monday-Friday . 8 am to 4 pm Saturday-Sunday     Your MyChart E-visit questionnaire answers were reviewed by a board certified advanced clinical practitioner to complete your personal care plan based on your specific symptoms.  Thank you for using e-Visits.   I provided 5 minutes of non face-to-face time during this encounter for chart review and documentation.  

## 2021-01-16 ENCOUNTER — Other Ambulatory Visit (HOSPITAL_COMMUNITY): Payer: Self-pay

## 2021-01-17 DIAGNOSIS — Z3A12 12 weeks gestation of pregnancy: Secondary | ICD-10-CM | POA: Diagnosis not present

## 2021-01-17 DIAGNOSIS — Z3481 Encounter for supervision of other normal pregnancy, first trimester: Secondary | ICD-10-CM | POA: Diagnosis not present

## 2021-01-17 DIAGNOSIS — Z3682 Encounter for antenatal screening for nuchal translucency: Secondary | ICD-10-CM | POA: Diagnosis not present

## 2021-01-22 ENCOUNTER — Ambulatory Visit (INDEPENDENT_AMBULATORY_CARE_PROVIDER_SITE_OTHER): Payer: 59 | Admitting: Otolaryngology

## 2021-01-24 ENCOUNTER — Encounter: Payer: 59 | Admitting: Internal Medicine

## 2021-01-25 DIAGNOSIS — H5202 Hypermetropia, left eye: Secondary | ICD-10-CM | POA: Diagnosis not present

## 2021-03-09 DIAGNOSIS — Z363 Encounter for antenatal screening for malformations: Secondary | ICD-10-CM | POA: Diagnosis not present

## 2021-03-09 DIAGNOSIS — Z3400 Encounter for supervision of normal first pregnancy, unspecified trimester: Secondary | ICD-10-CM | POA: Diagnosis not present

## 2021-03-09 DIAGNOSIS — Z3A19 19 weeks gestation of pregnancy: Secondary | ICD-10-CM | POA: Diagnosis not present

## 2021-04-03 DIAGNOSIS — Z3482 Encounter for supervision of other normal pregnancy, second trimester: Secondary | ICD-10-CM | POA: Diagnosis not present

## 2021-04-03 DIAGNOSIS — Z8632 Personal history of gestational diabetes: Secondary | ICD-10-CM | POA: Diagnosis not present

## 2021-04-03 DIAGNOSIS — Z3A22 22 weeks gestation of pregnancy: Secondary | ICD-10-CM | POA: Diagnosis not present

## 2021-04-03 DIAGNOSIS — Z7689 Persons encountering health services in other specified circumstances: Secondary | ICD-10-CM | POA: Diagnosis not present

## 2021-04-03 DIAGNOSIS — Z362 Encounter for other antenatal screening follow-up: Secondary | ICD-10-CM | POA: Diagnosis not present

## 2021-04-10 DIAGNOSIS — O99810 Abnormal glucose complicating pregnancy: Secondary | ICD-10-CM | POA: Diagnosis not present

## 2021-04-17 ENCOUNTER — Other Ambulatory Visit (HOSPITAL_COMMUNITY): Payer: Self-pay

## 2021-04-17 MED ORDER — FREESTYLE LITE TEST VI STRP
ORAL_STRIP | 9 refills | Status: DC
  Filled 2021-04-17: qty 100, 25d supply, fill #0

## 2021-04-17 MED ORDER — FREESTYLE LANCETS MISC
99 refills | Status: DC
  Filled 2021-04-17: qty 100, 25d supply, fill #0

## 2021-05-18 DIAGNOSIS — Z23 Encounter for immunization: Secondary | ICD-10-CM | POA: Diagnosis not present

## 2021-05-18 DIAGNOSIS — Z3480 Encounter for supervision of other normal pregnancy, unspecified trimester: Secondary | ICD-10-CM | POA: Diagnosis not present

## 2021-05-18 DIAGNOSIS — Z3400 Encounter for supervision of normal first pregnancy, unspecified trimester: Secondary | ICD-10-CM | POA: Diagnosis not present

## 2021-05-18 LAB — OB RESULTS CONSOLE HIV ANTIBODY (ROUTINE TESTING): HIV: NONREACTIVE

## 2021-06-18 DIAGNOSIS — Z3483 Encounter for supervision of other normal pregnancy, third trimester: Secondary | ICD-10-CM | POA: Diagnosis not present

## 2021-06-18 DIAGNOSIS — Z3482 Encounter for supervision of other normal pregnancy, second trimester: Secondary | ICD-10-CM | POA: Diagnosis not present

## 2021-07-06 DIAGNOSIS — Z3685 Encounter for antenatal screening for Streptococcus B: Secondary | ICD-10-CM | POA: Diagnosis not present

## 2021-07-06 LAB — OB RESULTS CONSOLE GBS: GBS: NEGATIVE

## 2021-07-24 NOTE — H&P (Signed)
Renee Nelson is a 30 y.o. female presenting for IOL s/s A1GDM. Pregnancy uncomplicated. 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 FOB is adopted and has not been tested.   OB History     Gravida  1   Para  1   Term  1   Preterm      AB      Living  1      SAB      IAB      Ectopic      Multiple  0   Live Births  1          Past Medical History:  Diagnosis Date   Gestational diabetes    managed by diet   Past Surgical History:  Procedure Laterality Date   TONSILLECTOMY     Family History: family history includes CAD in her paternal grandmother; Diabetes Mellitus II in her paternal grandfather. Social History:  reports that she has never smoked. She has never used smokeless tobacco. She reports that she does not drink alcohol and does not use drugs.     Maternal Diabetes: Yes:  Diabetes Type:  Diet controlled Genetic Screening: Normal Maternal Ultrasounds/Referrals: Normal Fetal Ultrasounds or other Referrals:  None Maternal Substance Abuse:  No Significant Maternal Medications:  None Significant Maternal Lab Results:  Group B Strep negative Other Comments:  None  Review of Systems History   currently breastfeeding. Exam Physical Exam  Prenatal labs: ABO, Rh:   Antibody:   Rubella:   RPR:    HBsAg:    HIV:    GBS:     Assessment/Plan: 30 yo G2P1001 @ 39.0wga presenting for IOL s/s A1GDM under good control.. Cervix unfavorable. Plan for cytotec followed by pitocin/AROM when more favorable.  GBS negative.    Ranae Pila 07/24/2021, 8:58 AM

## 2021-07-25 ENCOUNTER — Encounter (HOSPITAL_COMMUNITY): Payer: Self-pay | Admitting: Obstetrics and Gynecology

## 2021-07-25 ENCOUNTER — Inpatient Hospital Stay (HOSPITAL_COMMUNITY): Payer: 59 | Admitting: Anesthesiology

## 2021-07-25 ENCOUNTER — Inpatient Hospital Stay (HOSPITAL_COMMUNITY)
Admission: AD | Admit: 2021-07-25 | Discharge: 2021-07-27 | DRG: 807 | Disposition: A | Discharge status: Home / Self Care | Payer: 59 | Attending: Obstetrics and Gynecology | Admitting: Obstetrics and Gynecology

## 2021-07-25 ENCOUNTER — Inpatient Hospital Stay (HOSPITAL_COMMUNITY): Payer: 59

## 2021-07-25 ENCOUNTER — Other Ambulatory Visit: Payer: Self-pay

## 2021-07-25 DIAGNOSIS — Z3A39 39 weeks gestation of pregnancy: Secondary | ICD-10-CM

## 2021-07-25 DIAGNOSIS — O24420 Gestational diabetes mellitus in childbirth, diet controlled: Principal | ICD-10-CM | POA: Diagnosis present

## 2021-07-25 DIAGNOSIS — Z20822 Contact with and (suspected) exposure to covid-19: Secondary | ICD-10-CM | POA: Diagnosis present

## 2021-07-25 DIAGNOSIS — O24429 Gestational diabetes mellitus in childbirth, unspecified control: Secondary | ICD-10-CM | POA: Diagnosis not present

## 2021-07-25 DIAGNOSIS — Z3490 Encounter for supervision of normal pregnancy, unspecified, unspecified trimester: Secondary | ICD-10-CM

## 2021-07-25 LAB — RESP PANEL BY RT-PCR (FLU A&B, COVID) ARPGX2
Influenza A by PCR: NEGATIVE
Influenza B by PCR: NEGATIVE
SARS Coronavirus 2 by RT PCR: NEGATIVE

## 2021-07-25 LAB — CBC
HCT: 32.8 % — ABNORMAL LOW (ref 36.0–46.0)
Hemoglobin: 10.4 g/dL — ABNORMAL LOW (ref 12.0–15.0)
MCH: 26.9 pg (ref 26.0–34.0)
MCHC: 31.7 g/dL (ref 30.0–36.0)
MCV: 85 fL (ref 80.0–100.0)
Platelets: 260 10*3/uL (ref 150–400)
RBC: 3.86 MIL/uL — ABNORMAL LOW (ref 3.87–5.11)
RDW: 13 % (ref 11.5–15.5)
WBC: 12.9 10*3/uL — ABNORMAL HIGH (ref 4.0–10.5)
nRBC: 0 % (ref 0.0–0.2)

## 2021-07-25 LAB — TYPE AND SCREEN
ABO/RH(D): A POS
Antibody Screen: NEGATIVE

## 2021-07-25 LAB — RPR: RPR Ser Ql: NONREACTIVE

## 2021-07-25 LAB — GLUCOSE, CAPILLARY
Glucose-Capillary: 103 mg/dL — ABNORMAL HIGH (ref 70–99)
Glucose-Capillary: 90 mg/dL (ref 70–99)

## 2021-07-25 MED ORDER — LACTATED RINGERS IV SOLN: INTRAVENOUS | Status: DC

## 2021-07-25 MED ORDER — LACTATED RINGERS IV SOLN: 500.0000 mL | INTRAVENOUS | Status: DC | PRN

## 2021-07-25 MED ORDER — TRANEXAMIC ACID-NACL 1000-0.7 MG/100ML-% IV SOLN
INTRAVENOUS | Status: AC
  Administered 2021-07-25: 08:00:00 1000 mg
  Filled 2021-07-25: qty 100

## 2021-07-25 MED ORDER — COCONUT OIL OIL: 1.0000 "application " | TOPICAL_OIL | Status: DC | PRN

## 2021-07-25 MED ORDER — OXYCODONE-ACETAMINOPHEN 5-325 MG PO TABS: 2.0000 | ORAL_TABLET | ORAL | Status: DC | PRN

## 2021-07-25 MED ORDER — BUTORPHANOL TARTRATE 1 MG/ML IJ SOLN: 1.0000 mg | INTRAMUSCULAR | Status: DC | PRN

## 2021-07-25 MED ORDER — PRENATAL MULTIVITAMIN CH
1.0000 | ORAL_TABLET | Freq: Every day | ORAL | Status: DC
  Administered 2021-07-25 – 2021-07-26 (×2): 1 via ORAL
  Filled 2021-07-25 (×2): qty 1

## 2021-07-25 MED ORDER — SOD CITRATE-CITRIC ACID 500-334 MG/5ML PO SOLN: 30.0000 mL | ORAL | Status: DC | PRN

## 2021-07-25 MED ORDER — OXYCODONE-ACETAMINOPHEN 5-325 MG PO TABS: 1.0000 | ORAL_TABLET | ORAL | Status: DC | PRN

## 2021-07-25 MED ORDER — TRANEXAMIC ACID-NACL 1000-0.7 MG/100ML-% IV SOLN: 1000.0000 mg | INTRAVENOUS | Status: DC

## 2021-07-25 MED ORDER — SIMETHICONE 80 MG PO CHEW
80.0000 mg | CHEWABLE_TABLET | ORAL | Status: DC | PRN
  Filled 2021-07-25: qty 1

## 2021-07-25 MED ORDER — OXYTOCIN BOLUS FROM INFUSION
333.0000 mL | Freq: Once | INTRAVENOUS | Status: AC
  Administered 2021-07-25: 08:00:00 333 mL via INTRAVENOUS

## 2021-07-25 MED ORDER — TERBUTALINE SULFATE 1 MG/ML IJ SOLN: 0.2500 mg | Freq: Once | INTRAMUSCULAR | Status: DC | PRN

## 2021-07-25 MED ORDER — MISOPROSTOL 25 MCG QUARTER TABLET
25.0000 ug | ORAL_TABLET | Freq: Once | ORAL | Status: DC
  Filled 2021-07-25: qty 1

## 2021-07-25 MED ORDER — HYDROXYZINE HCL 50 MG PO TABS: 50.0000 mg | ORAL_TABLET | Freq: Four times a day (QID) | ORAL | Status: DC | PRN

## 2021-07-25 MED ORDER — OXYCODONE HCL 5 MG PO TABS: 5.0000 mg | ORAL_TABLET | ORAL | Status: DC | PRN

## 2021-07-25 MED ORDER — DIPHENHYDRAMINE HCL 50 MG/ML IJ SOLN: 12.5000 mg | INTRAMUSCULAR | Status: DC | PRN

## 2021-07-25 MED ORDER — ACETAMINOPHEN 325 MG PO TABS: 650.0000 mg | ORAL_TABLET | ORAL | Status: DC | PRN

## 2021-07-25 MED ORDER — SENNOSIDES-DOCUSATE SODIUM 8.6-50 MG PO TABS
2.0000 | ORAL_TABLET | ORAL | Status: DC
  Administered 2021-07-25 – 2021-07-26 (×2): 2 via ORAL
  Filled 2021-07-25 (×2): qty 2

## 2021-07-25 MED ORDER — ONDANSETRON HCL 4 MG/2ML IJ SOLN: 4.0000 mg | Freq: Four times a day (QID) | INTRAMUSCULAR | Status: DC | PRN

## 2021-07-25 MED ORDER — WITCH HAZEL-GLYCERIN EX PADS: 1.0000 "application " | MEDICATED_PAD | CUTANEOUS | Status: DC | PRN

## 2021-07-25 MED ORDER — TETANUS-DIPHTH-ACELL PERTUSSIS 5-2.5-18.5 LF-MCG/0.5 IM SUSY: 0.5000 mL | PREFILLED_SYRINGE | Freq: Once | INTRAMUSCULAR | Status: DC

## 2021-07-25 MED ORDER — BENZOCAINE-MENTHOL 20-0.5 % EX AERO
1.0000 "application " | INHALATION_SPRAY | CUTANEOUS | Status: DC | PRN
  Administered 2021-07-25: 1 via TOPICAL
  Filled 2021-07-25: qty 56

## 2021-07-25 MED ORDER — EPHEDRINE 5 MG/ML INJ: 10.0000 mg | INTRAVENOUS | Status: DC | PRN

## 2021-07-25 MED ORDER — FENTANYL-BUPIVACAINE-NACL 0.5-0.125-0.9 MG/250ML-% EP SOLN
12.0000 mL/h | EPIDURAL | Status: DC | PRN
  Administered 2021-07-25: 04:00:00 12 mL/h via EPIDURAL
  Filled 2021-07-25: qty 250

## 2021-07-25 MED ORDER — LIDOCAINE HCL (PF) 1 % IJ SOLN: 30.0000 mL | INTRAMUSCULAR | Status: DC | PRN

## 2021-07-25 MED ORDER — OXYTOCIN-SODIUM CHLORIDE 30-0.9 UT/500ML-% IV SOLN: 2.5000 [IU]/h | INTRAVENOUS | Status: DC

## 2021-07-25 MED ORDER — DIBUCAINE (PERIANAL) 1 % EX OINT: 1.0000 "application " | TOPICAL_OINTMENT | CUTANEOUS | Status: DC | PRN

## 2021-07-25 MED ORDER — DIPHENHYDRAMINE HCL 25 MG PO CAPS: 25.0000 mg | ORAL_CAPSULE | Freq: Four times a day (QID) | ORAL | Status: DC | PRN

## 2021-07-25 MED ORDER — ONDANSETRON HCL 4 MG PO TABS: 4.0000 mg | ORAL_TABLET | ORAL | Status: DC | PRN

## 2021-07-25 MED ORDER — PHENYLEPHRINE 40 MCG/ML (10ML) SYRINGE FOR IV PUSH (FOR BLOOD PRESSURE SUPPORT): 80.0000 ug | PREFILLED_SYRINGE | INTRAVENOUS | Status: DC | PRN

## 2021-07-25 MED ORDER — ACETAMINOPHEN 325 MG PO TABS
650.0000 mg | ORAL_TABLET | ORAL | Status: DC | PRN
  Administered 2021-07-26: 09:00:00 650 mg via ORAL
  Filled 2021-07-25: qty 2

## 2021-07-25 MED ORDER — ZOLPIDEM TARTRATE 5 MG PO TABS: 5.0000 mg | ORAL_TABLET | Freq: Every evening | ORAL | Status: DC | PRN

## 2021-07-25 MED ORDER — ONDANSETRON HCL 4 MG/2ML IJ SOLN: 4.0000 mg | INTRAMUSCULAR | Status: DC | PRN

## 2021-07-25 MED ORDER — LACTATED RINGERS IV SOLN
500.0000 mL | Freq: Once | INTRAVENOUS | Status: AC
  Administered 2021-07-25: 03:00:00 500 mL via INTRAVENOUS

## 2021-07-25 MED ORDER — LIDOCAINE-EPINEPHRINE (PF) 2 %-1:200000 IJ SOLN
INTRAMUSCULAR | Status: DC | PRN
  Administered 2021-07-25: 5 mL via EPIDURAL

## 2021-07-25 MED ORDER — OXYCODONE HCL 5 MG PO TABS: 10.0000 mg | ORAL_TABLET | ORAL | Status: DC | PRN

## 2021-07-25 MED ORDER — OXYTOCIN-SODIUM CHLORIDE 30-0.9 UT/500ML-% IV SOLN
1.0000 m[IU]/min | INTRAVENOUS | Status: DC
  Filled 2021-07-25: qty 500

## 2021-07-25 MED ORDER — IBUPROFEN 600 MG PO TABS
600.0000 mg | ORAL_TABLET | Freq: Four times a day (QID) | ORAL | Status: DC
  Administered 2021-07-25 – 2021-07-27 (×8): 600 mg via ORAL
  Filled 2021-07-25 (×8): qty 1

## 2021-07-25 NOTE — Anesthesia Procedure Notes (Signed)
Epidural Patient location during procedure: OB Start time: 07/25/2021 3:35 AM End time: 07/25/2021 3:45 AM  Staffing Anesthesiologist: Elmer Picker, MD Performed: anesthesiologist   Preanesthetic Checklist Completed: patient identified, IV checked, risks and benefits discussed, monitors and equipment checked, pre-op evaluation and timeout performed  Epidural Patient position: sitting Prep: DuraPrep and site prepped and draped Patient monitoring: continuous pulse ox, blood pressure, heart rate and cardiac monitor Approach: midline Location: L3-L4 Injection technique: LOR air  Needle:  Needle type: Tuohy  Needle gauge: 17 G Needle length: 9 cm Needle insertion depth: 5 cm Catheter type: closed end flexible Catheter size: 19 Gauge Catheter at skin depth: 10 cm Test dose: negative  Assessment Sensory level: T8 Events: blood not aspirated, injection not painful, no injection resistance, no paresthesia and negative IV test  Additional Notes Patient identified. Risks/Benefits/Options discussed with patient including but not limited to bleeding, infection, nerve damage, paralysis, failed block, incomplete pain control, headache, blood pressure changes, nausea, vomiting, reactions to medication both or allergic, itching and postpartum back pain. Confirmed with bedside nurse the patient's most recent platelet count. Confirmed with patient that they are not currently taking any anticoagulation, have any bleeding history or any family history of bleeding disorders. Patient expressed understanding and wished to proceed. All questions were answered. Sterile technique was used throughout the entire procedure. Please see nursing notes for vital signs. Test dose was given through epidural catheter and negative prior to continuing to dose epidural or start infusion. Warning signs of high block given to the patient including shortness of breath, tingling/numbness in hands, complete motor block,  or any concerning symptoms with instructions to call for help. Patient was given instructions on fall risk and not to get out of bed. All questions and concerns addressed with instructions to call with any issues or inadequate analgesia.  Reason for block:procedure for pain

## 2021-07-25 NOTE — Anesthesia Preprocedure Evaluation (Signed)
Anesthesia Evaluation  Patient identified by MRN, date of birth, ID band Patient awake    Reviewed: Allergy & Precautions, NPO status , Patient's Chart, lab work & pertinent test results  Airway Mallampati: II  TM Distance: >3 FB Neck ROM: Full    Dental no notable dental hx.    Pulmonary neg pulmonary ROS,    Pulmonary exam normal breath sounds clear to auscultation       Cardiovascular negative cardio ROS Normal cardiovascular exam Rhythm:Regular Rate:Normal     Neuro/Psych negative neurological ROS  negative psych ROS   GI/Hepatic negative GI ROS, Neg liver ROS,   Endo/Other  negative endocrine ROSdiabetes  Renal/GU negative Renal ROS  negative genitourinary   Musculoskeletal negative musculoskeletal ROS (+)   Abdominal   Peds  Hematology negative hematology ROS (+)   Anesthesia Other Findings IOL for gDM  Reproductive/Obstetrics (+) Pregnancy                             Anesthesia Physical Anesthesia Plan  ASA: 2  Anesthesia Plan: Epidural   Post-op Pain Management:    Induction:   PONV Risk Score and Plan: Treatment may vary due to age or medical condition  Airway Management Planned: Natural Airway  Additional Equipment:   Intra-op Plan:   Post-operative Plan:   Informed Consent: I have reviewed the patients History and Physical, chart, labs and discussed the procedure including the risks, benefits and alternatives for the proposed anesthesia with the patient or authorized representative who has indicated his/her understanding and acceptance.       Plan Discussed with: Anesthesiologist  Anesthesia Plan Comments: (Patient identified. Risks, benefits, options discussed with patient including but not limited to bleeding, infection, nerve damage, paralysis, failed block, incomplete pain control, headache, blood pressure changes, nausea, vomiting, reactions to  medication, itching, and post partum back pain. Confirmed with bedside nurse the patient's most recent platelet count. Confirmed with the patient that they are not taking any anticoagulation, have any bleeding history or any family history of bleeding disorders. Patient expressed understanding and wishes to proceed. All questions were answered. )        Anesthesia Quick Evaluation

## 2021-07-25 NOTE — Progress Notes (Signed)
C/c/2 Push

## 2021-07-25 NOTE — Lactation Note (Addendum)
This note was copied from a baby's chart. Lactation Consultation Note  Patient Name: Renee Nelson [id number] Date: 07/25/2021 Reason for consult: Initial assessment;Mother's request;Difficult latch;Term;Breastfeeding assistance;Maternal endocrine disorder Age:30 hours  On arrival, Mom stated infant just completed a 10 min feeding. LC gave 1 ml of colostrum on a spoon, infant fell asleep.  Plans 1. To feed based on cues 8-12x 24 hr period. Mom to offer breasts and look for signs of milk transfer.  2. If unable to latch, Mom to offer EBM via spoon 5-7 ml 3. I and O sheet reviewed.  All questions answered at the end of the visit.   Infant glucose low x3. LC attempted latch both football and cross cradle, infant popping on and off. Mom consented DBM 7 ml given. RN working with infant to offer more.  Mom set up DEBP q 3hrs for 15 min.   Plan 1. To feed based on cues 8-12x 24hr period. Mom to offer breast with compression noting signs of milk transfer.  2. Dad to supplement with EBM first followed by DBM, BF supplementation guide provided. Parents aware to offer more if infant not latching.  3. Mom to pump with DEBP as stated above.   Maternal Data Has patient been taught Hand Expression?: Yes Does the patient have breastfeeding experience prior to this delivery?: Yes How long did the patient breastfeed?: 15 months  Feeding Mother's Current Feeding Choice: Breast Milk  LATCH Score Latch: Grasps breast easily, tongue down, lips flanged, rhythmical sucking.  Audible Swallowing: A few with stimulation  Type of Nipple: Everted at rest and after stimulation  Comfort (Breast/Nipple): Soft / non-tender  Hold (Positioning): Assistance needed to correctly position infant at breast and maintain latch.  LATCH Score: 8   Lactation Tools Discussed/Used    Interventions Interventions: Breast feeding basics reviewed;Assisted with latch;Skin to skin;Hand express;Breast compression;Adjust  position;Support pillows;Position options;Expressed milk;Education;LC Psychologist, educational;Infant Driven Feeding Algorithm education  Discharge Pump: Personal  Consult Status Consult Status: Follow-up Date: 07/26/21 Follow-up type: In-patient    Renee Nelson  Renee Nelson 07/25/2021, 12:52 PM

## 2021-07-25 NOTE — Anesthesia Postprocedure Evaluation (Signed)
Anesthesia Post Note  Patient: Renee Nelson  Procedure(s) Performed: AN AD HOC LABOR EPIDURAL     Patient location during evaluation: Mother Baby Anesthesia Type: Epidural Level of consciousness: awake Pain management: satisfactory to patient Vital Signs Assessment: post-procedure vital signs reviewed and stable Respiratory status: spontaneous breathing Cardiovascular status: stable Anesthetic complications: no   No notable events documented.  Last Vitals:  Vitals:   07/25/21 0952 07/25/21 1110  BP: 121/68 117/78  Pulse: 81 73  Resp: 16 17  Temp: 36.8 C 36.7 C  SpO2: 100%     Last Pain:  Vitals:   07/25/21 1110  TempSrc: Oral  PainSc:    Pain Goal:                   [employer]

## 2021-07-25 NOTE — Lactation Note (Signed)
This note was copied from a baby's chart. Lactation Consultation Note  Patient Name: Renee Nelson DTOIZ'T Date: 07/25/2021 Reason for consult: L&D Initial assessment;Maternal endocrine disorder Age:31 hours  P2, Mother states she had difficulty latching first child born at 44 weeks and had hyperbilirubinemia so she pumped for 15 mos. This baby is spitty, transitioning, started to cue when [facility] was in room.  Attempted in various positions until baby started sucking.   Lactation to follow up on MBU.   Maternal Data Has patient been taught Hand Expression?: Yes Does the patient have breastfeeding experience prior to this delivery?: Yes How long did the patient breastfeed?:  (Per mother 37 weeks difficulty latching, pumped for 15 mos.)  Feeding Mother's Current Feeding Choice: Breast Milk  LATCH Score Latch: Repeated attempts needed to sustain latch, nipple held in mouth throughout feeding, stimulation needed to elicit sucking reflex.  Audible Swallowing: A few with stimulation  Type of Nipple: Everted at rest and after stimulation  Comfort (Breast/Nipple): Soft / non-tender  Hold (Positioning): Assistance needed to correctly position infant at breast and maintain latch.  LATCH Score: 7    Consult Status Consult Status: Follow-up from L&D    Dahlia Byes [facility] 07/25/2021, 9:02 AM

## 2021-07-25 NOTE — H&P (Signed)
Renee Nelson is a 30 y.o. female G2P1001 at [redacted]w[redacted]d presenting for IOL secondary to A1DM.  On admission, RN checked cervix and it was 6/80/-1 with SROM during exam.  Patient reports feeling pressure and uncomfortable today.  She has started to feel mild CTX after ROM. 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 FOB is adopted and has not been tested. GBS negative.  OB History     Gravida  2   Para  1   Term  1   Preterm      AB      Living  1      SAB      IAB      Ectopic      Multiple  0   Live Births  1          Past Medical History:  Diagnosis Date   Gestational diabetes    managed by diet   Past Surgical History:  Procedure Laterality Date   TONSILLECTOMY     Family History: family history includes CAD in her paternal grandmother; Diabetes Mellitus II in her paternal grandfather. Social History:  reports that she has never smoked. She has never used smokeless tobacco. She reports that she does not drink alcohol and does not use drugs.     Maternal Diabetes: Yes:  Diabetes Type:  Diet controlled Genetic Screening: Normal Maternal Ultrasounds/Referrals: Normal Fetal Ultrasounds or other Referrals:  None Maternal Substance Abuse:  No Significant Maternal Medications:  None Significant Maternal Lab Results:  Group B Strep negative Other Comments:  None  Review of Systems Maternal Medical History:  Reason for admission: Rupture of membranes and contractions.   Contractions: Onset was less than 1 hour ago.   Frequency: irregular.   Perceived severity is mild.   Fetal activity: Perceived fetal activity is normal.   Last perceived fetal movement was within the past hour.   Prenatal complications: no prenatal complications Prenatal Complications - Diabetes: none.  Dilation: 6 Effacement (%): 80 Station: -1 Exam by:: J Mbugua RN Blood pressure 123/76, pulse 82, temperature 98.6 F (37 C), temperature source Oral, resp. rate 16, height 5\' 3"  (1.6 m),  weight 90.7 kg, currently breastfeeding. Maternal Exam:  Uterine Assessment: Contraction strength is mild.  Contraction frequency is irregular.  Abdomen: Patient reports no abdominal tenderness. Fundal height is c/w dates.   Estimated fetal weight is 8#12.     Fetal Exam Fetal Monitor Review: Baseline rate: 140.  Variability: moderate (6-25 bpm).   Pattern: accelerations present and no decelerations.   Fetal State Assessment: Category I - tracings are normal.  Physical Exam Constitutional:      Appearance: Normal appearance.  HENT:     Head: Normocephalic and atraumatic.  Pulmonary:     Effort: Pulmonary effort is normal.  Abdominal:     Palpations: Abdomen is soft.  Musculoskeletal:        General: Normal range of motion.     Cervical back: Normal range of motion.  Skin:    General: Skin is warm and dry.  Neurological:     Mental Status: She is alert and oriented to person, place, and time.  Psychiatric:        Mood and Affect: Mood normal.        Behavior: Behavior normal.    Prenatal labs: ABO, Rh: A positive Antibody: Negative Rubella:  Immune RPR:   NR HBsAg:   Negative HIV:   NR GBS:   Negative  Assessment/Plan: 30yo  G2P1001 at 39 weeks with labor -CLEA planned once admit labs return -A1DM-CBG on admit 103; ordered q 4h -Augment prn with pitocin -Anticipate NSVD   Mitchel Honour 07/25/2021, 3:19 AM

## 2021-07-26 LAB — CBC
HCT: 29.5 % — ABNORMAL LOW (ref 36.0–46.0)
Hemoglobin: 9.6 g/dL — ABNORMAL LOW (ref 12.0–15.0)
MCH: 27.8 pg (ref 26.0–34.0)
MCHC: 32.5 g/dL (ref 30.0–36.0)
MCV: 85.5 fL (ref 80.0–100.0)
Platelets: 198 10*3/uL (ref 150–400)
RBC: 3.45 MIL/uL — ABNORMAL LOW (ref 3.87–5.11)
RDW: 13 % (ref 11.5–15.5)
WBC: 10.6 10*3/uL — ABNORMAL HIGH (ref 4.0–10.5)
nRBC: 0 % (ref 0.0–0.2)

## 2021-07-26 NOTE — Progress Notes (Signed)
Patient screened out for psychosocial assessment since none of the following apply: Psychosocial stressors documented in mother or baby's chart Gestation less than 32 weeks Code at delivery  Infant with anomalies Please contact the Clinical Social Worker if specific needs arise or by MOB's request. MOB's  Edinburgh Score is 5.  Kimm Ungaro Boyd-Gilyard, MSW, LCSW Clinical Social Work [phone] 

## 2021-07-26 NOTE — Lactation Note (Signed)
This note was copied from a baby's chart. Lactation Consultation Note  Patient Name: Renee Nelson [id number] Date: 07/26/2021 Reason for consult: Follow-up assessment;NICU baby;Term Age:31 hours  Lactation followed up with Renee Nelson. She reports that she is pumping consistently. She has experience as she exclusively pumped for her first child, now 31 years old. We discussed baby [name]'s feeding plan. She is using her EBM and DBM. She has a history of abundant milk volume. She is considering exclusively pumping and bottle feeding [name] due her preference. I offered lactation services on the NICU floor including latch help. She verbalized understanding.  Maternal Data Has patient been taught Hand Expression?: Yes Does the patient have breastfeeding experience prior to this delivery?: Yes How long did the patient breastfeed?: pumped 15 months  Feeding Mother's Current Feeding Choice: Breast Milk and Donor Milk Nipple Type: Slow - flow   Lactation Tools Discussed/Used Breast pump type: Double-Electric Breast Pump Pump Education: Setup, frequency, and cleaning Reason for Pumping: NICU Pumping frequency: rec q3 hours or 8 times/day Pumped volume: 3 mL  Interventions Interventions: Breast feeding basics reviewed;DEBP;Ice;LC Services brochure  Discharge Discharge Education: Outpatient recommendation Pump: DEBP;Personal (Spectra and Luna Motif)  Consult Status Consult Status: Follow-up Date: 07/26/21 Follow-up type: In-patient    Walker Shadow 07/26/2021, 10:21 AM

## 2021-07-26 NOTE — Progress Notes (Signed)
PPD # 1  Patient is doing very well. Baby is in NICU for hypoglycemia and doing very well.  BP 125/85 (BP Location: Left Arm)    Pulse 75    Temp 98 F (36.7 C) (Oral)    Resp 18    Ht 5\' 3"  (1.6 m)    Wt 90.7 kg    SpO2 100%    Breastfeeding Unknown    BMI 35.41 kg/m  Results for orders placed or performed during the hospital encounter of 07/25/21 (from the past 24 hour(s))  CBC     Status: Abnormal   Collection Time: 07/26/21  4:50 AM  Result Value Ref Range   WBC 10.6 (H) 4.0 - 10.5 K/uL   RBC 3.45 (L) 3.87 - 5.11 MIL/uL   Hemoglobin 9.6 (L) 12.0 - 15.0 g/dL   HCT 07/28/21 (L) 24.0 - 97.3 %   MCV 85.5 80.0 - 100.0 fL   MCH 27.8 26.0 - 34.0 pg   MCHC 32.5 30.0 - 36.0 g/dL   RDW 53.2 99.2 - 42.6 %   Platelets 198 150 - 400 K/uL   nRBC 0.0 0.0 - 0.2 %   Uterus is firm and non tender  Lochia WNL  PPD # 1  Doing very well Routine care

## 2021-07-27 NOTE — Plan of Care (Signed)
Problem: Education: Goal: Knowledge of General Education information will improve Description: Including pain rating scale, medication(s)/side effects and non-pharmacologic comfort measures Outcome: Completed/Met   Problem: Health Behavior/Discharge Planning: Goal: Ability to manage health-related needs will improve Outcome: Completed/Met   Problem: Clinical Measurements: Goal: Ability to maintain clinical measurements within normal limits will improve Outcome: Completed/Met Goal: Will remain free from infection Outcome: Completed/Met Goal: Diagnostic test results will improve Outcome: Completed/Met Goal: Respiratory complications will improve Outcome: Completed/Met Goal: Cardiovascular complication will be avoided Outcome: Completed/Met   Problem: Activity: Goal: Risk for activity intolerance will decrease Outcome: Completed/Met   Problem: Nutrition: Goal: Adequate nutrition will be maintained Outcome: Completed/Met   Problem: Elimination: Goal: Will not experience complications related to bowel motility Outcome: Completed/Met Goal: Will not experience complications related to urinary retention Outcome: Completed/Met   Problem: Pain Managment: Goal: General experience of comfort will improve Outcome: Completed/Met   Problem: Safety: Goal: Ability to remain free from injury will improve Outcome: Completed/Met   Problem: Skin Integrity: Goal: Risk for impaired skin integrity will decrease Outcome: Completed/Met   Problem: Education: Goal: Knowledge of condition will improve Outcome: Completed/Met Goal: Individualized Educational Video(s) Outcome: Completed/Met Goal: Individualized Newborn Educational Video(s) Outcome: Completed/Met   Problem: Activity: Goal: Will verbalize the importance of balancing activity with adequate rest periods Outcome: Completed/Met Goal: Ability to tolerate increased activity will improve Outcome: Completed/Met   Problem:  Coping: Goal: Ability to identify and utilize available resources and services will improve Outcome: Completed/Met   Problem: Life Cycle: Goal: Chance of risk for complications during the postpartum period will decrease Outcome: Completed/Met   Problem: Role Relationship: Goal: Ability to demonstrate positive interaction with newborn will improve Outcome: Completed/Met   Problem: Skin Integrity: Goal: Demonstration of wound healing without infection will improve Outcome: Completed/Met

## 2021-07-27 NOTE — Lactation Note (Signed)
This note was copied from a baby's chart.  NICU Lactation Consultation Note  Patient Name: Renee Nelson [id number] Date: 07/27/2021 Age:31 hours   Subjective Reason for consult: Maternal discharge; Follow-up assessment; Other (Comment) (infant d/c) Mother endorses + breast changes today and increase output when pumping. She has slight breast fullness that is relieved by pumping. Mother shares that she and baby will d/c today. She feels comfortable with her plan to pump and bottle feed.  We reviewed outpatient LC services prn.  Objective Infant data: Mother's Current Feeding Choice: Breast Milk  Infant feeding assessment Scale for Readiness: 1 Scale for Quality: 1    Maternal data: G2P2002  Vaginal, Spontaneous Current breast feeding challenges:: NICU; separation  Previous breastfeeding challenges?: Exclusive pump and bottle fed  Does the patient have breastfeeding experience prior to this delivery?: Yes How long did the patient breastfeed?: pumped 15 months  Pumping frequency: q3 Pumped volume: 75 mL  No data recorded  Pump: DEBP, Personal (Spectra and Luna Motif)  Assessment Infant: Feeding Status: Ad lib   Maternal: Normal breast changes and volume at 50+ hours pp. No s/s of engorgement.  Intervention/Plan Interventions: Breast feeding basics reviewed; DEBP; Ice; LC Services brochure  Pump Education: Setup, frequency, and cleaning  Plan: Consult Status: Complete  NICU Follow-up type: Verify onset of copious milk; Verify absence of engorgement  Mother to maintain pumping routine. Mother to f/u with LC prn.  Elder Negus 07/27/2021, 10:53 AM

## 2021-07-27 NOTE — Plan of Care (Signed)
Problem: Education: Goal: Knowledge of General Education information will improve Description: Including pain rating scale, medication(s)/side effects and non-pharmacologic comfort measures Outcome: Completed/Met   Problem: Clinical Measurements: Goal: Ability to maintain clinical measurements within normal limits will improve Outcome: Completed/Met Goal: Will remain free from infection Outcome: Completed/Met Goal: Diagnostic test results will improve Outcome: Completed/Met Goal: Respiratory complications will improve Outcome: Completed/Met Goal: Cardiovascular complication will be avoided Outcome: Completed/Met   Problem: Activity: Goal: Risk for activity intolerance will decrease Outcome: Completed/Met   Problem: Nutrition: Goal: Adequate nutrition will be maintained Outcome: Completed/Met   Problem: Elimination: Goal: Will not experience complications related to bowel motility Outcome: Completed/Met Goal: Will not experience complications related to urinary retention Outcome: Completed/Met   Problem: Pain Managment: Goal: General experience of comfort will improve Outcome: Completed/Met   Problem: Safety: Goal: Ability to remain free from injury will improve Outcome: Completed/Met   Problem: Skin Integrity: Goal: Risk for impaired skin integrity will decrease Outcome: Completed/Met   Problem: Education: Goal: Knowledge of condition will improve Outcome: Completed/Met Goal: Individualized Educational Video(s) Outcome: Completed/Met   Problem: Activity: Goal: Will verbalize the importance of balancing activity with adequate rest periods Outcome: Completed/Met Goal: Ability to tolerate increased activity will improve Outcome: Completed/Met   Problem: Coping: Goal: Ability to identify and utilize available resources and services will improve Outcome: Completed/Met   Problem: Life Cycle: Goal: Chance of risk for complications during the postpartum period  will decrease Outcome: Completed/Met   Problem: Role Relationship: Goal: Ability to demonstrate positive interaction with newborn will improve Outcome: Completed/Met   Problem: Skin Integrity: Goal: Demonstration of wound healing without infection will improve Outcome: Completed/Met

## 2021-07-27 NOTE — Discharge Summary (Signed)
Postpartum Discharge Summary       Patient Name: Renee Nelson DOB: 11-Aug-1990 MRN: [id number]  Date of admission: 07/25/2021 Delivery date:07/25/2021  Delivering provider: Tyson Dense  Date of discharge: 07/27/2021  Admitting diagnosis: Pregnancy [Z34.90] Intrauterine pregnancy: [redacted]w[redacted]d    Secondary diagnosis:  Principal Problem:   Pregnancy  Additional problems: A!DM    Discharge diagnosis: Term Pregnancy Delivered and GDM A1                                              Post partum procedures:   Augmentation: N/A Complications: None  Hospital course: Onset of Labor With Vaginal Delivery      30y.o. yo [id number] admitted in Active Labor on 07/25/2021. Patient had an uncomplicated labor course as follows:  Membrane Rupture Time/Date: 2:40 AM ,07/25/2021   Delivery Method:Vaginal, Spontaneous  Episiotomy: None  Lacerations:  1st degree  Patient had an uncomplicated postpartum course.  She is ambulating, tolerating a regular diet, passing flatus, and urinating well. Patient is discharged home in stable condition on 07/27/21.  Newborn Data: Birth date:07/25/2021  Birth time:8:16 AM  Gender:Female  Living status:Living  Apgars:9 ,9  Weight:4500 g   Magnesium Sulfate received: No BMZ received: No Rhophylac:N/A MMR:N/A T-DaP:Given prenatally Flu: N/A Transfusion:No  Physical exam  Vitals:   07/26/21 0500 07/26/21 1344 07/26/21 2225 07/27/21 0500  BP: 125/85 127/63 116/64 115/74  Pulse: 75 69 81 77  Resp: _0 Temp:   98 F (36.7 C) 98.2 F (36.8 C)  TempSrc: Oral  Oral Oral  SpO2: 100% 100%    Weight:      Height:       General: alert, cooperative, and no distress Lochia: appropriate Uterine Fundus: firm Incision: N/A DVT Evaluation: No evidence of DVT seen on physical exam. Labs: Lab Results  Component Value Date   WBC 10.6 (H) 07/26/2021   HGB 9.6 (L) 07/26/2021   HCT 29.5 (L) 07/26/2021   MCV 85.5 07/26/2021    PLT 198 07/26/2021   No flowsheet data found. Edinburgh Score: Edinburgh Postnatal Depression Scale Screening Tool 07/25/2021  I have been able to laugh and see the funny side of things. 0  I have looked forward with enjoyment to things. 0  I have blamed myself unnecessarily when things went wrong. 1  I have been anxious or worried for no good reason. 2  I have felt scared or panicky for no good reason. 0  Things have been getting on top of me. 1  I have been so unhappy that I have had difficulty sleeping. 0  I have felt sad or miserable. 0  I have been so unhappy that I have been crying. 1  The thought of harming myself has occurred to me. 0  Edinburgh Postnatal Depression Scale Total 5      After visit meds:  Allergies as of 07/27/2021       Reactions   Penicillins Rash        Medication List     STOP taking these medications    benzonatate 100 MG capsule Commonly known as: TESSALON   doxycycline 100 MG tablet Commonly known as: VIBRA-TABS   Doxylamine-Pyridoxine 10-10 MG Tbec   freestyle lancets   FREESTYLE LITE test strip Generic drug: glucose blood  TAKE these medications    prenatal multivitamin Tabs tablet Take 1 tablet by mouth daily at 12 noon.         Discharge home in stable condition Infant Feeding: Breast Infant Disposition:home with mother Discharge instruction: per After Visit Summary and Postpartum booklet. Activity: Advance as tolerated. Pelvic rest for 6 weeks.  Diet: carb modified diet Anticipated Birth Control: Unsure Postpartum Appointment:6 weeks Additional Postpartum F/U:    Future Appointments:No future appointments. Follow up Visit:      07/27/2021 Luz Lex, MD

## 2021-07-31 ENCOUNTER — Telehealth: Payer: 59 | Admitting: Physician Assistant

## 2021-07-31 DIAGNOSIS — B9689 Other specified bacterial agents as the cause of diseases classified elsewhere: Secondary | ICD-10-CM

## 2021-07-31 DIAGNOSIS — J208 Acute bronchitis due to other specified organisms: Secondary | ICD-10-CM

## 2021-07-31 MED ORDER — AZITHROMYCIN 250 MG PO TABS: ORAL_TABLET | ORAL | 0 refills | Status: AC

## 2021-07-31 NOTE — Progress Notes (Signed)
We are sorry that you are not feeling well.  Here is how we plan to help!  Based on your presentation I believe you most likely have A cough due to bacteria.  When patients have a fever and a productive cough with a change in color or increased sputum production, we are concerned about bacterial bronchitis.  If left untreated it can progress to pneumonia.  If your symptoms do not improve with your treatment plan it is important that you contact your provider.   I have prescribed Azithromyin 250 mg: two tablets now and then one tablet daily for 4 additonal days    In addition you may use A non-prescription cough medication called Robitussin DAC. Take 2 teaspoons every 8 hours or Delsym: take 2 teaspoons every 12 hours.  From your responses in the eVisit questionnaire you describe inflammation in the upper respiratory tract which is causing a significant cough.  This is commonly called Bronchitis and has four common causes:   Allergies Viral Infections Acid Reflux Bacterial Infection Allergies, viruses and acid reflux are treated by controlling symptoms or eliminating the cause. An example might be a cough caused by taking certain blood pressure medications. You stop the cough by changing the medication. Another example might be a cough caused by acid reflux. Controlling the reflux helps control the cough.  USE OF BRONCHODILATOR ("RESCUE") INHALERS: There is a risk from using your bronchodilator too frequently.  The risk is that over-reliance on a medication which only relaxes the muscles surrounding the breathing tubes can reduce the effectiveness of medications prescribed to reduce swelling and congestion of the tubes themselves.  Although you feel brief relief from the bronchodilator inhaler, your asthma may actually be worsening with the tubes becoming more swollen and filled with mucus.  This can delay other crucial treatments, such as oral steroid medications. If you need to use a bronchodilator  inhaler daily, several times per day, you should discuss this with your provider.  There are probably better treatments that could be used to keep your asthma under control.     HOME CARE Only take medications as instructed by your medical team. Complete the entire course of an antibiotic. Drink plenty of fluids and get plenty of rest. Avoid close contacts especially the very [place] and the elderly Cover your mouth if you cough or cough into your sleeve. Always remember to wash your hands A steam or ultrasonic humidifier can help congestion.   GET HELP RIGHT AWAY IF: You develop worsening fever. You become short of breath You cough up blood. Your symptoms persist after you have completed your treatment plan MAKE SURE YOU  Understand these instructions. Will watch your condition. Will get help right away if you are not doing well or get worse.    Thank you for choosing an e-visit.  Your e-visit answers were reviewed by a board certified advanced clinical practitioner to complete your personal care plan. Depending upon the condition, your plan could have included both over the counter or prescription medications.  Please review your pharmacy choice. Make sure the pharmacy is open so you can pick up prescription now. If there is a problem, you may contact your provider through [employer] and have the prescription routed to another pharmacy.  Your safety is important to [place]. If you have drug allergies check your prescription carefully.   For the next 24 hours you can use MyChart to ask questions about today's visit, request a non-urgent call back, or ask for  a work or school excuse. You will get an email in the next two days asking about your experience. I hope that your e-visit has been valuable and will speed your recovery.  I provided 5 minutes of non face-to-face time during this encounter for chart review and documentation.

## 2021-08-07 ENCOUNTER — Telehealth (HOSPITAL_COMMUNITY): Payer: Self-pay

## 2021-08-07 NOTE — Telephone Encounter (Signed)
"  I'm good." Patient has no questions or concerns about her healing.  "He's good, he's asleep right now. Eating and gaining weight. We actually went to the pediatrician all last week and his bilirubin level is good now and he has gained over his birthweight. He sleeps in a bassinet next to our bed." RN reviewed ABC's of safe sleep with patient. Patient declines any questions or concerns about baby.  EPDS score is 4.  [name] [facility] 08/07/2020,1246

## 2021-09-04 DIAGNOSIS — Z1389 Encounter for screening for other disorder: Secondary | ICD-10-CM | POA: Diagnosis not present

## 2021-09-04 DIAGNOSIS — Z124 Encounter for screening for malignant neoplasm of cervix: Secondary | ICD-10-CM | POA: Diagnosis not present

## 2021-10-01 ENCOUNTER — Telehealth: Payer: 59 | Admitting: Physician Assistant

## 2021-10-01 DIAGNOSIS — H109 Unspecified conjunctivitis: Secondary | ICD-10-CM

## 2021-10-01 MED ORDER — POLYMYXIN B-TRIMETHOPRIM 10000-0.1 UNIT/ML-% OP SOLN: 1.0000 [drp] | OPHTHALMIC | 0 refills | Status: DC

## 2021-10-01 NOTE — Progress Notes (Signed)

## 2021-10-19 ENCOUNTER — Other Ambulatory Visit (HOSPITAL_COMMUNITY): Payer: Self-pay

## 2021-10-19 MED ORDER — CLINDAMYCIN HCL 300 MG PO CAPS
ORAL_CAPSULE | ORAL | 0 refills | Status: DC
  Filled 2021-10-19: qty 28, 7d supply, fill #0

## 2021-10-29 DIAGNOSIS — N63 Unspecified lump in unspecified breast: Secondary | ICD-10-CM | POA: Diagnosis not present

## 2021-10-30 ENCOUNTER — Other Ambulatory Visit: Payer: Self-pay | Admitting: Obstetrics & Gynecology

## 2021-10-30 DIAGNOSIS — N63 Unspecified lump in unspecified breast: Secondary | ICD-10-CM

## 2021-11-13 ENCOUNTER — Other Ambulatory Visit: Payer: 59

## 2021-12-03 ENCOUNTER — Ambulatory Visit
Admission: RE | Admit: 2021-12-03 | Discharge: 2021-12-03 | Disposition: A | Discharge status: Home / Self Care | Payer: 59 | Source: Ambulatory Visit | Attending: Obstetrics & Gynecology | Admitting: Obstetrics & Gynecology

## 2021-12-03 DIAGNOSIS — N63 Unspecified lump in unspecified breast: Secondary | ICD-10-CM

## 2021-12-03 DIAGNOSIS — N6314 Unspecified lump in the right breast, lower inner quadrant: Secondary | ICD-10-CM | POA: Diagnosis not present

## 2021-12-03 DIAGNOSIS — N6312 Unspecified lump in the right breast, upper inner quadrant: Secondary | ICD-10-CM | POA: Diagnosis not present

## 2021-12-03 DIAGNOSIS — R922 Inconclusive mammogram: Secondary | ICD-10-CM | POA: Diagnosis not present

## 2021-12-03 DIAGNOSIS — N6489 Other specified disorders of breast: Secondary | ICD-10-CM | POA: Diagnosis not present

## 2022-01-24 ENCOUNTER — Other Ambulatory Visit: Payer: Self-pay | Admitting: Obstetrics & Gynecology

## 2022-01-24 DIAGNOSIS — N6310 Unspecified lump in the right breast, unspecified quadrant: Secondary | ICD-10-CM

## 2022-03-25 ENCOUNTER — Ambulatory Visit: Payer: 59 | Admitting: Internal Medicine

## 2022-04-03 ENCOUNTER — Encounter: Payer: 59 | Admitting: Internal Medicine

## 2022-04-29 ENCOUNTER — Telehealth: Payer: 59 | Admitting: Physician Assistant

## 2022-04-29 DIAGNOSIS — J019 Acute sinusitis, unspecified: Secondary | ICD-10-CM

## 2022-04-29 DIAGNOSIS — B9689 Other specified bacterial agents as the cause of diseases classified elsewhere: Secondary | ICD-10-CM

## 2022-04-29 MED ORDER — AZITHROMYCIN 250 MG PO TABS: ORAL_TABLET | ORAL | 0 refills | Status: AC

## 2022-04-29 NOTE — Progress Notes (Signed)

## 2022-05-18 IMAGING — US US BREAST*L* LIMITED INC AXILLA
1 series · 2 of 2 positions shown · non-contrast
Comparison: None.

CLINICAL DATA: 30-year-old female currently breast feeding with 2
palpable RIGHT breast lumps identified on self-examination,
unchanged for 1-1/2 months, and UPPER LEFT breast thickening
identified on clinical examination. Baseline mammogram.



[Series 1: us breast*left* limited inc axilla · 0.08mm/px · 2 of 2 slices shown]
[im 1/2]
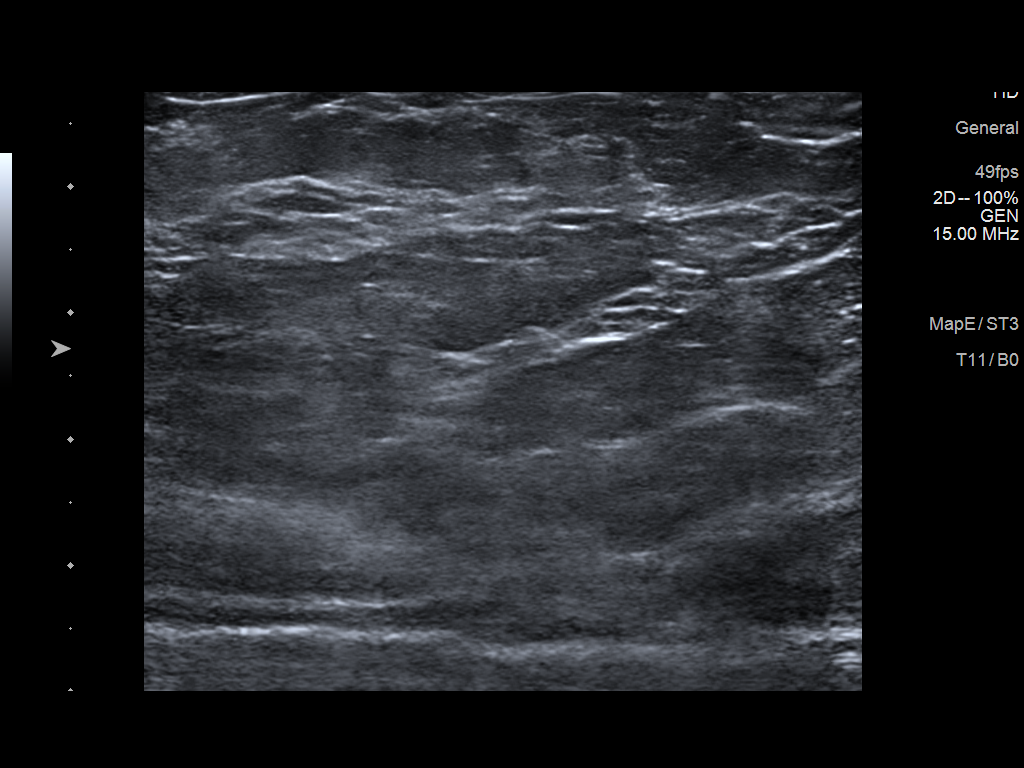
[im 2/2]
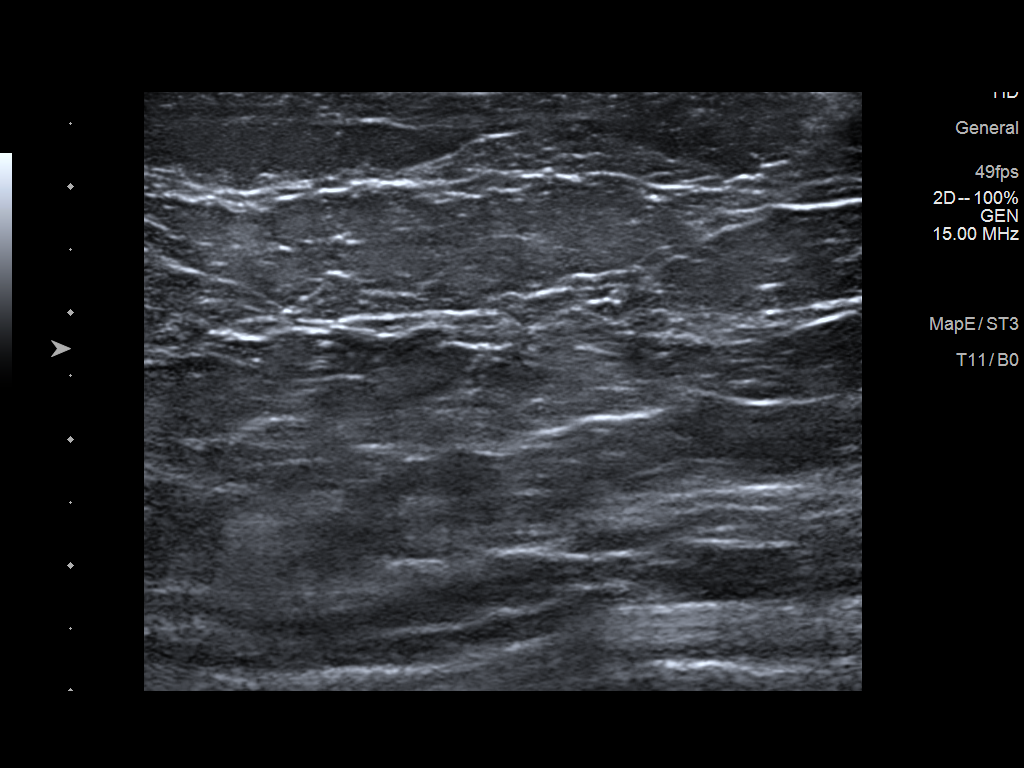

[2 of 2 positions shown; findings below may reference images not displayed]

ACR Breast Density Category c: The breast tissue is heterogeneously
dense, which may obscure small masses.
FINDINGS: 2D/3D full field views of both breasts and spot compression views of
the RIGHT breast demonstrate no suspicious mass, distortion or
worrisome calcifications. Only fatty tissue is noted underlying the
BBs placed at site of RIGHT breast palpable abnormalities and no
suspicious mass is identified.

On physical exam, palpable mobile masses are identified at the 3
o'clock position of the RIGHT breast 8 cm from the nipple and at the
[DATE] position of the RIGHT breast 6 cm from the nipple.

Targeted ultrasound is performed, showing the following:

RIGHT:

Two adjacent circumscribed oval homogeneously hyperechoic parallel
masses at the 3 o'clock position of the RIGHT breast 8 cm from the
nipple measuring 1.4 x 0.6 x 1.4 cm and 1.1 x 0.6 x 1 cm.

A similar appearing 1.7 x 0.8 x 2 cm circumscribed oval
homogeneously hyperechoic mass identified at the [DATE] position of
the RIGHT breast 6 cm from the nipple.

No abnormal RIGHT axillary lymph nodes are noted.

LEFT:

No sonographic abnormalities are identified within the UPPER LEFT
breast, in the area of clinical concern.
IMPRESSION: 1. Two adjacent hyperechoic masses within the INNER RIGHT breast and
a 2 cm hyperechoic mass within the INNER RIGHT breast. These masses
are almost certainly benign and favor lipomas over galactoceles
given mammographic and sonographic findings. Six-month follow-up
recommended.
2. No mammographic or sonographic abnormality within the UPPER LEFT
breast, in the area of clinical concern.
3. No mammographic evidence of breast malignancy.

RECOMMENDATION:
RIGHT breast ultrasound in 6 months.

Consider clinical follow-up as indicated. Any further workup should
be based on clinical grounds.

I have discussed the findings and recommendations with the patient.
The patient was instructed to contact her primary physician or our
office if the RIGHT breast masses significantly increased in size.
If applicable, a reminder letter will be sent to the patient
regarding the next appointment.

BI-RADS CATEGORY  3: Probably benign.

## 2022-05-18 IMAGING — MG DIGITAL DIAGNOSTIC BILAT W/ TOMO W/ CAD
6 of 12 series · 6 of 36 positions shown · non-contrast
Comparison: None.

CLINICAL DATA: 30-year-old female currently breast feeding with 2
palpable RIGHT breast lumps identified on self-examination,
unchanged for 1-1/2 months, and UPPER LEFT breast thickening
identified on clinical examination. Baseline mammogram.



[R MLO synth-2D]
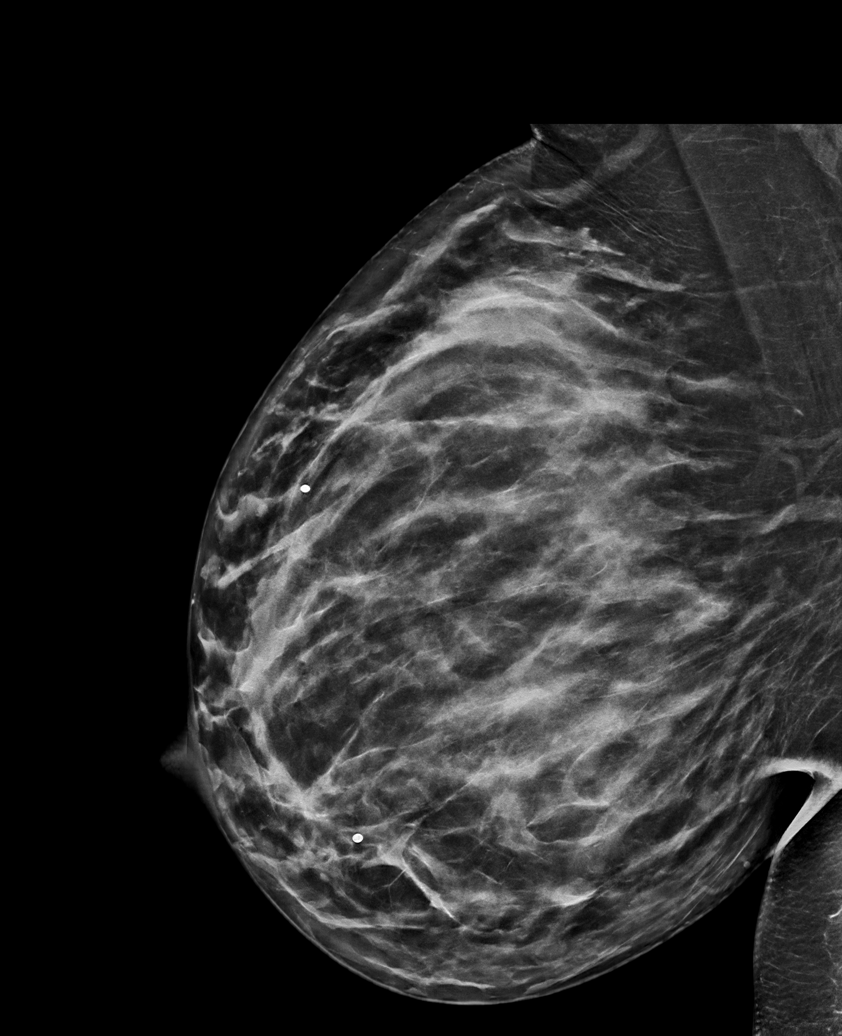

[L CC synth-2D]
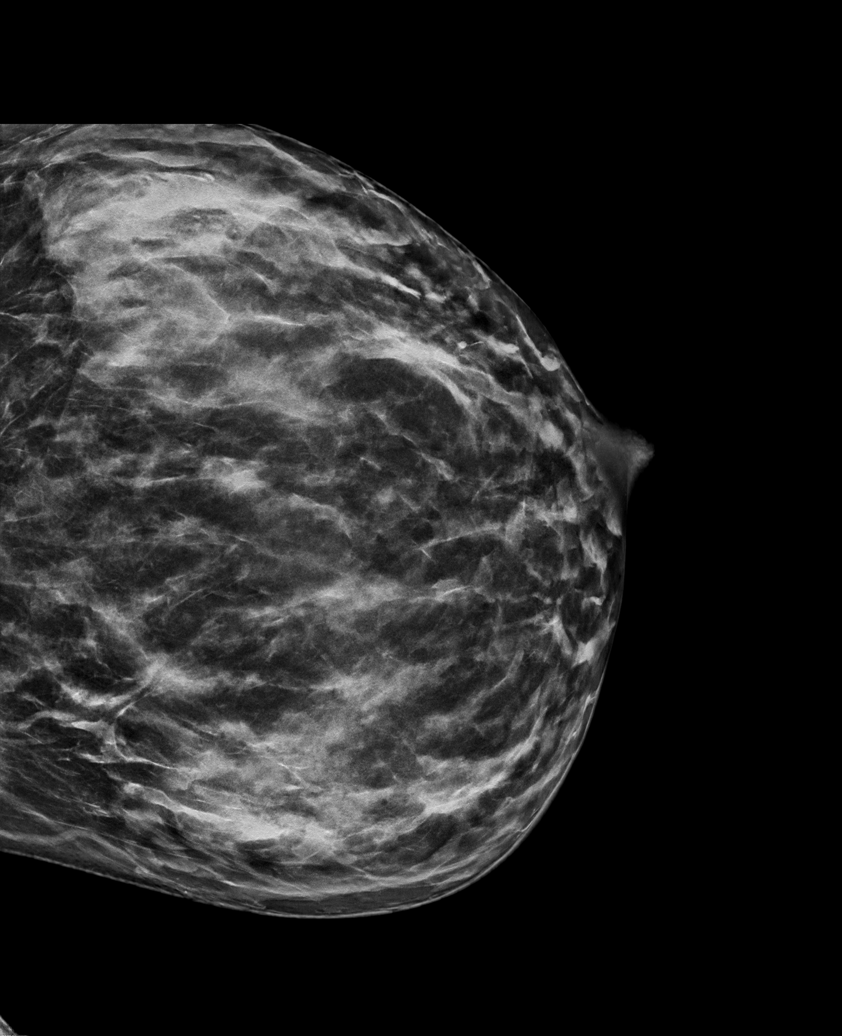

[R TAN synth-2D (1 of 2)]
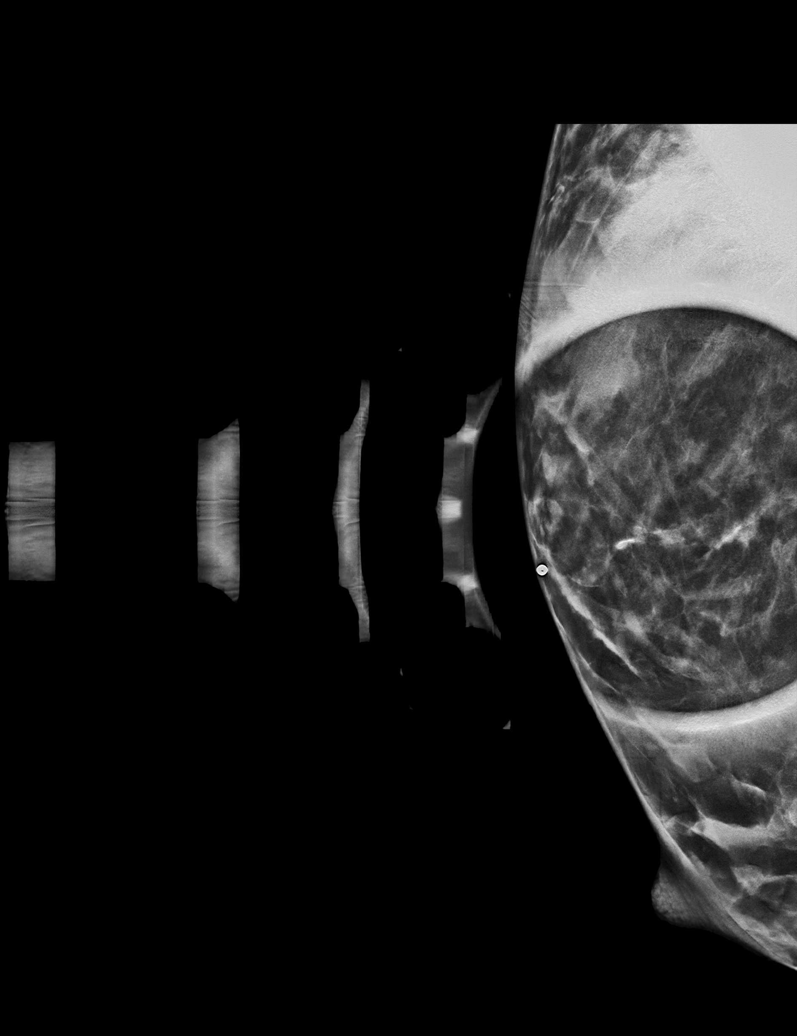

[R TAN synth-2D (2 of 2)]
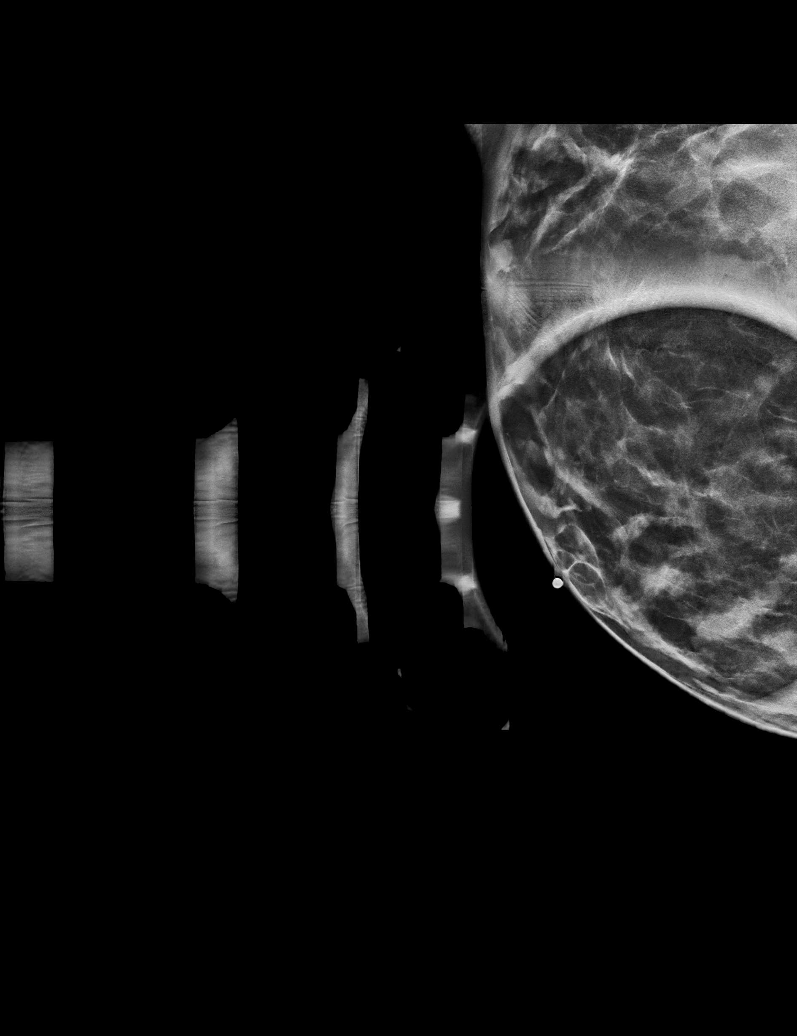

[L MLO synth-2D]
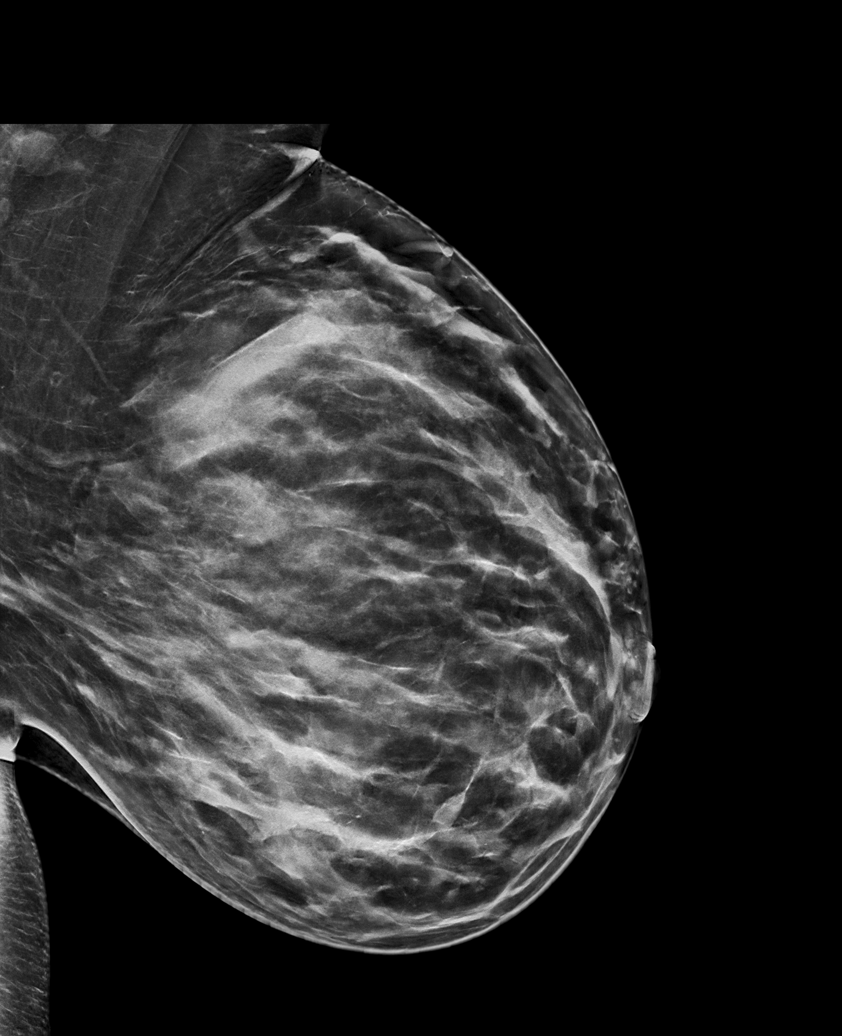

[R CC synth-2D]
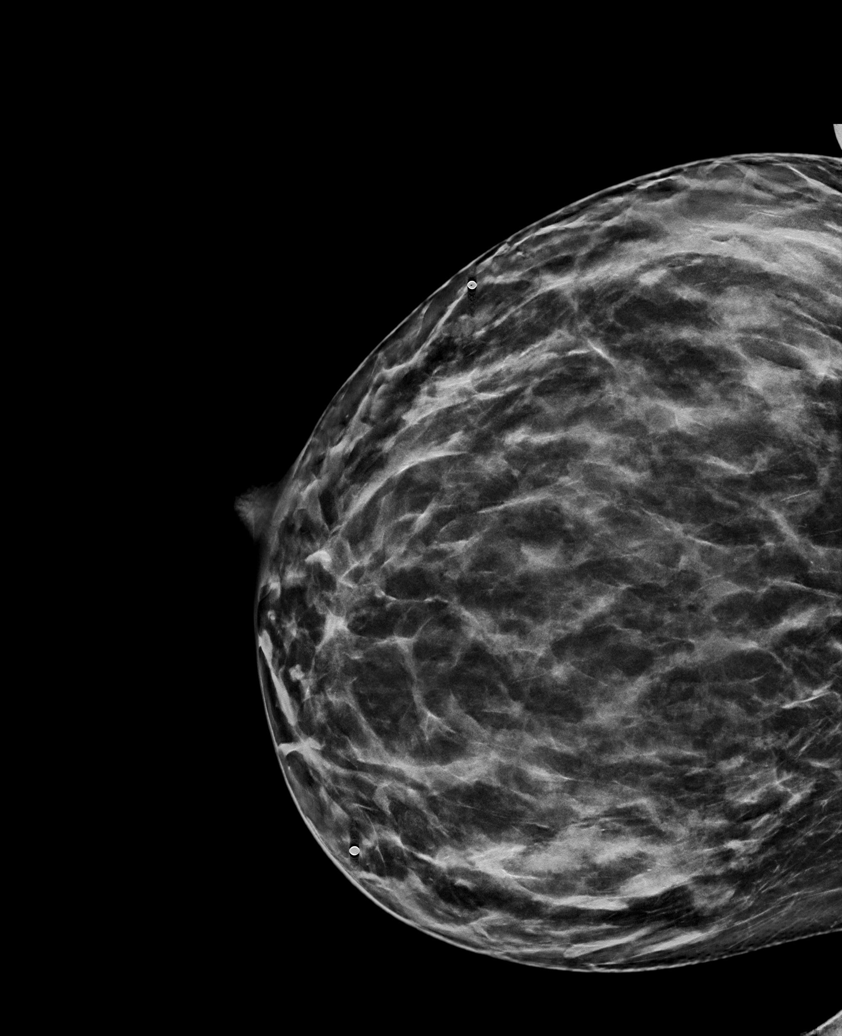

[6 of 36 positions shown; findings below may reference images not displayed]

ACR Breast Density Category c: The breast tissue is heterogeneously
dense, which may obscure small masses.
FINDINGS: 2D/3D full field views of both breasts and spot compression views of
the RIGHT breast demonstrate no suspicious mass, distortion or
worrisome calcifications. Only fatty tissue is noted underlying the
BBs placed at site of RIGHT breast palpable abnormalities and no
suspicious mass is identified.

On physical exam, palpable mobile masses are identified at the 3
o'clock position of the RIGHT breast 8 cm from the nipple and at the
[DATE] position of the RIGHT breast 6 cm from the nipple.

Targeted ultrasound is performed, showing the following:

RIGHT:

Two adjacent circumscribed oval homogeneously hyperechoic parallel
masses at the 3 o'clock position of the RIGHT breast 8 cm from the
nipple measuring 1.4 x 0.6 x 1.4 cm and 1.1 x 0.6 x 1 cm.

A similar appearing 1.7 x 0.8 x 2 cm circumscribed oval
homogeneously hyperechoic mass identified at the [DATE] position of
the RIGHT breast 6 cm from the nipple.

No abnormal RIGHT axillary lymph nodes are noted.

LEFT:

No sonographic abnormalities are identified within the UPPER LEFT
breast, in the area of clinical concern.
IMPRESSION: 1. Two adjacent hyperechoic masses within the INNER RIGHT breast and
a 2 cm hyperechoic mass within the INNER RIGHT breast. These masses
are almost certainly benign and favor lipomas over galactoceles
given mammographic and sonographic findings. Six-month follow-up
recommended.
2. No mammographic or sonographic abnormality within the UPPER LEFT
breast, in the area of clinical concern.
3. No mammographic evidence of breast malignancy.

RECOMMENDATION:
RIGHT breast ultrasound in 6 months.

Consider clinical follow-up as indicated. Any further workup should
be based on clinical grounds.

I have discussed the findings and recommendations with the patient.
The patient was instructed to contact her primary physician or our
office if the RIGHT breast masses significantly increased in size.
If applicable, a reminder letter will be sent to the patient
regarding the next appointment.

BI-RADS CATEGORY  3: Probably benign.

## 2022-06-06 ENCOUNTER — Ambulatory Visit
Admission: RE | Admit: 2022-06-06 | Discharge: 2022-06-06 | Disposition: A | Discharge status: Home / Self Care | Payer: 59 | Source: Ambulatory Visit | Attending: Obstetrics & Gynecology | Admitting: Obstetrics & Gynecology

## 2022-06-06 ENCOUNTER — Other Ambulatory Visit: Payer: Self-pay | Admitting: Obstetrics & Gynecology

## 2022-06-06 DIAGNOSIS — N6310 Unspecified lump in the right breast, unspecified quadrant: Secondary | ICD-10-CM

## 2022-06-06 DIAGNOSIS — N6312 Unspecified lump in the right breast, upper inner quadrant: Secondary | ICD-10-CM | POA: Diagnosis not present

## 2022-06-06 DIAGNOSIS — N6314 Unspecified lump in the right breast, lower inner quadrant: Secondary | ICD-10-CM | POA: Diagnosis not present

## 2022-08-03 ENCOUNTER — Telehealth: Payer: 59 | Admitting: Physician Assistant

## 2022-08-03 DIAGNOSIS — J019 Acute sinusitis, unspecified: Secondary | ICD-10-CM | POA: Diagnosis not present

## 2022-08-03 DIAGNOSIS — B9689 Other specified bacterial agents as the cause of diseases classified elsewhere: Secondary | ICD-10-CM | POA: Diagnosis not present

## 2022-08-03 MED ORDER — AMOXICILLIN-POT CLAVULANATE 875-125 MG PO TABS: 1.0000 | ORAL_TABLET | Freq: Two times a day (BID) | ORAL | 0 refills | Status: DC

## 2022-08-03 MED ORDER — AZITHROMYCIN 250 MG PO TABS: ORAL_TABLET | ORAL | 0 refills | Status: AC

## 2022-08-03 NOTE — Progress Notes (Signed)

## 2022-08-03 NOTE — Addendum Note (Signed)
Addended by: Myrla Halsted on: 08/03/2022 08:52 AM   Modules accepted: Orders

## 2022-10-17 ENCOUNTER — Other Ambulatory Visit (HOSPITAL_COMMUNITY): Payer: Self-pay

## 2022-10-17 DIAGNOSIS — Z13228 Encounter for screening for other metabolic disorders: Secondary | ICD-10-CM | POA: Diagnosis not present

## 2022-10-17 DIAGNOSIS — Z1329 Encounter for screening for other suspected endocrine disorder: Secondary | ICD-10-CM | POA: Diagnosis not present

## 2022-10-17 DIAGNOSIS — Z6827 Body mass index (BMI) 27.0-27.9, adult: Secondary | ICD-10-CM | POA: Diagnosis not present

## 2022-10-17 DIAGNOSIS — Z131 Encounter for screening for diabetes mellitus: Secondary | ICD-10-CM | POA: Diagnosis not present

## 2022-10-17 DIAGNOSIS — Z13220 Encounter for screening for lipoid disorders: Secondary | ICD-10-CM | POA: Diagnosis not present

## 2022-10-17 DIAGNOSIS — Z01419 Encounter for gynecological examination (general) (routine) without abnormal findings: Secondary | ICD-10-CM | POA: Diagnosis not present

## 2022-10-17 MED ORDER — LO LOESTRIN FE 1 MG-10 MCG / 10 MCG PO TABS
1.0000 | ORAL_TABLET | Freq: Every day | ORAL | 3 refills | Status: DC
  Filled 2022-10-17 – 2022-11-17 (×2): qty 84, 84d supply, fill #0
  Filled 2023-02-05: qty 84, 84d supply, fill #1
  Filled 2023-04-22: qty 84, 84d supply, fill #2
  Filled 2023-07-23: qty 84, 84d supply, fill #3

## 2022-11-01 ENCOUNTER — Other Ambulatory Visit (HOSPITAL_COMMUNITY): Payer: Self-pay

## 2022-11-10 ENCOUNTER — Telehealth: Payer: 59 | Admitting: Family

## 2022-11-10 DIAGNOSIS — J019 Acute sinusitis, unspecified: Secondary | ICD-10-CM

## 2022-11-10 MED ORDER — DOXYCYCLINE HYCLATE 100 MG PO TABS: 100.0000 mg | ORAL_TABLET | Freq: Two times a day (BID) | ORAL | 0 refills | Status: DC

## 2022-11-10 NOTE — Progress Notes (Signed)
E-Visit for Sinus Problems  We are sorry that you are not feeling well.  Here is how we plan to help!  Based on what you have shared with me it looks like you have sinusitis.  Sinusitis is inflammation and infection in the sinus cavities of the head.  Based on your presentation I believe you most likely have Acute Bacterial Sinusitis.  This is an infection caused by bacteria and is treated with antibiotics. I have prescribed Doxycycline 100mg by mouth twice a day for 10 days. You may use an oral decongestant such as Mucinex D or if you have glaucoma or high blood pressure use plain Mucinex. Saline nasal spray help and can safely be used as often as needed for congestion.  If you develop worsening sinus pain, fever or notice severe headache and vision changes, or if symptoms are not better after completion of antibiotic, please schedule an appointment with a health care provider.    Sinus infections are not as easily transmitted as other respiratory infection, however we still recommend that you avoid close contact with loved ones, especially the very [place] and elderly.  Remember to wash your hands thoroughly throughout the day as this is the number one way to prevent the spread of infection!  Home Care: Only take medications as instructed by your medical team. Complete the entire course of an antibiotic. Do not take these medications with alcohol. A steam or ultrasonic humidifier can help congestion.  You can place a towel over your head and breathe in the steam from hot water coming from a faucet. Avoid close contacts especially the very [place] and the elderly. Cover your mouth when you cough or sneeze. Always remember to wash your hands.  Get Help Right Away If: You develop worsening fever or sinus pain. You develop a severe head ache or visual changes. Your symptoms persist after you have completed your treatment plan.  Make sure you Understand these instructions. Will watch your  condition. Will get help right away if you are not doing well or get worse.  Thank you for choosing an e-visit.  Your e-visit answers were reviewed by a board certified advanced clinical practitioner to complete your personal care plan. Depending upon the condition, your plan could have included both over the counter or prescription medications.  Please review your pharmacy choice. Make sure the pharmacy is open so you can pick up prescription now. If there is a problem, you may contact your provider through MyChart messaging and have the prescription routed to another pharmacy.  Your safety is important to us. If you have drug allergies check your prescription carefully.   For the next 24 hours you can use MyChart to ask questions about today's visit, request a non-urgent call back, or ask for a work or school excuse. You will get an email in the next two days asking about your experience. I hope that your e-visit has been valuable and will speed your recovery.   Approximately 5 minutes was spent documenting and reviewing patient's chart.   

## 2022-11-18 ENCOUNTER — Other Ambulatory Visit: Payer: Self-pay

## 2022-12-06 ENCOUNTER — Ambulatory Visit
Admission: RE | Admit: 2022-12-06 | Discharge: 2022-12-06 | Disposition: A | Discharge status: Home / Self Care | Payer: 59 | Source: Ambulatory Visit | Attending: Obstetrics & Gynecology | Admitting: Obstetrics & Gynecology

## 2022-12-06 ENCOUNTER — Ambulatory Visit (HOSPITAL_BASED_OUTPATIENT_CLINIC_OR_DEPARTMENT_OTHER): Payer: 59

## 2022-12-06 ENCOUNTER — Encounter (HOSPITAL_BASED_OUTPATIENT_CLINIC_OR_DEPARTMENT_OTHER): Payer: Self-pay | Admitting: Student

## 2022-12-06 ENCOUNTER — Ambulatory Visit (HOSPITAL_BASED_OUTPATIENT_CLINIC_OR_DEPARTMENT_OTHER): Payer: 59 | Admitting: Student

## 2022-12-06 ENCOUNTER — Other Ambulatory Visit (HOSPITAL_BASED_OUTPATIENT_CLINIC_OR_DEPARTMENT_OTHER): Payer: Self-pay

## 2022-12-06 DIAGNOSIS — M7652 Patellar tendinitis, left knee: Secondary | ICD-10-CM | POA: Diagnosis not present

## 2022-12-06 DIAGNOSIS — M25562 Pain in left knee: Secondary | ICD-10-CM | POA: Diagnosis not present

## 2022-12-06 DIAGNOSIS — Z1231 Encounter for screening mammogram for malignant neoplasm of breast: Secondary | ICD-10-CM | POA: Diagnosis not present

## 2022-12-06 DIAGNOSIS — M7651 Patellar tendinitis, right knee: Secondary | ICD-10-CM

## 2022-12-06 DIAGNOSIS — M7051 Other bursitis of knee, right knee: Secondary | ICD-10-CM | POA: Diagnosis not present

## 2022-12-06 DIAGNOSIS — N6310 Unspecified lump in the right breast, unspecified quadrant: Secondary | ICD-10-CM

## 2022-12-06 DIAGNOSIS — M25561 Pain in right knee: Secondary | ICD-10-CM | POA: Diagnosis not present

## 2022-12-06 DIAGNOSIS — M7052 Other bursitis of knee, left knee: Secondary | ICD-10-CM

## 2022-12-06 DIAGNOSIS — N6315 Unspecified lump in the right breast, overlapping quadrants: Secondary | ICD-10-CM | POA: Diagnosis not present

## 2022-12-06 MED ORDER — MELOXICAM 15 MG PO TABS
15.0000 mg | ORAL_TABLET | Freq: Every day | ORAL | 0 refills | Status: AC
  Filled 2022-12-06: qty 7, 7d supply, fill #0

## 2022-12-06 NOTE — Progress Notes (Signed)
Chief Complaint: Bilateral knee pain     History of Present Illness:    Renee Nelson is a 32 y.o. female presenting to clinic for evaluation of pain in both knees.  She states that this began 5 or 6 days ago shortly after performing an at-home video workout but does not recall any injury.  She states that she has just began to resume working out and exercise after having a baby a year ago.  She states that the pain is moderate and is located just below her kneecaps, slightly worse on her right side.  She has been taking ibuprofen which has been helping, however the pain returns once it wears off.  It has been waking her up at night.  Denies any previous knee injury.  She works as a Engineer, civil (consulting) and has two sons that are ages 52 and 1.   Surgical History:   None  PMH/PSH/Family History/Social History/Meds/Allergies:    Past Medical History:  Diagnosis Date   Gestational diabetes    managed by diet   Past Surgical History:  Procedure Laterality Date   TONSILLECTOMY     Social History   Socioeconomic History   Marital status: Married    Spouse name: Not on file   Number of children: Not on file   Years of education: Not on file   Highest education level: Not on file  Occupational History   Not on file  Tobacco Use   Smoking status: Never   Smokeless tobacco: Never  Vaping Use   Vaping Use: Never used  Substance and Sexual Activity   Alcohol use: Never   Drug use: Never   Sexual activity: Yes    Birth control/protection: None  Other Topics Concern   Not on file  Social History Narrative   Not on file   Social Determinants of Health   Financial Resource Strain: Low Risk  (04/02/2019)   Overall Financial Resource Strain (CARDIA)    Difficulty of Paying Living Expenses: Not hard at all  Food Insecurity: No Food Insecurity (04/02/2019)   Hunger Vital Sign    Worried About Running Out of Food in the Last Year: Never true    Ran Out  of Food in the Last Year: Never true  Transportation Needs: No Transportation Needs (04/02/2019)   PRAPARE - Administrator, Civil Service (Medical): No    Lack of Transportation (Non-Medical): No  Physical Activity: Sufficiently Active (04/02/2019)   Exercise Vital Sign    Days of Exercise per Week: 5 days    Minutes of Exercise per Session: 90 min  Stress: No Stress Concern Present (04/02/2019)   [employer] of Occupational Health - Occupational Stress Questionnaire    Feeling of Stress : Only a little  Social Connections: Socially Integrated (04/02/2019)   Social Connection and Isolation Panel [NHANES]    Frequency of Communication with Friends and Family: More than three times a week    Frequency of Social Gatherings with Friends and Family: Three times a week    Attends Religious Services: More than 4 times per year    Active Member of Clubs or Organizations: Yes    Attends Banker Meetings: More than 4 times per year    Marital Status: Married   Family History  Problem Relation Age  of Onset   CAD Paternal Grandmother    Diabetes Mellitus II Paternal Grandfather    Allergies  Allergen Reactions   Penicillins Rash   Current Outpatient Medications  Medication Sig Dispense Refill   meloxicam (MOBIC) 15 MG tablet Take 1 tablet (15 mg total) by mouth daily for 7 days. 7 tablet 0   doxycycline (VIBRA-TABS) 100 MG tablet Take 1 tablet (100 mg total) by mouth 2 (two) times daily. 20 tablet 0   Norethindrone-Ethinyl Estradiol-Fe Biphas (LO LOESTRIN FE) 1 MG-10 MCG / 10 MCG tablet Take 1 tablet by mouth daily. 84 tablet 3   Prenatal Vit-Fe Fumarate-FA (PRENATAL MULTIVITAMIN) TABS tablet Take 1 tablet by mouth daily at 12 noon.     No current facility-administered medications for this visit.   MM DIAG BREAST TOMO BILATERAL  Result Date: 12/06/2022 CLINICAL DATA:  One year interval follow-up of likely benign masses in the inner RIGHT breast at 3 o'clock 8 cm  from the nipple and in the upper-outer RIGHT breast at 9:30 o'clock 6 cm from nipple. Annual evaluation, LEFT breast. These masses were initially palpated during breast feeding. She states no family history of breast cancer. EXAM: DIGITAL DIAGNOSTIC BILATERAL MAMMOGRAM WITH TOMOSYNTHESIS; ULTRASOUND RIGHT BREAST LIMITED TECHNIQUE: Bilateral digital diagnostic mammography and breast tomosynthesis was performed.; Targeted ultrasound examination of the right breast was performed COMPARISON:  Previous exam(s). ACR Breast Density Category b: There are scattered areas of fibroglandular density. FINDINGS: Full field CC and MLO views of both breasts were obtained. The overall density of both breasts has significantly decreased since the mammogram 1 year ago as expected with cessation of breastfeeding. RIGHT: Circumscribed fat-containing mass superficially in the UPPER OUTER QUADRANT at middle depth measuring just over 1 cm in size, corresponding to the previously identified mass in the upper-outer breast on ultrasound. Developing dystrophic calcification involving the superolateral margin of the mass. No associated architectural distortion or suspicious calcifications. Adjacent circumscribed fat-containing masses superficially in the inner breast at anterior to middle depth measuring approximately 1.5 and 1 cm respectively, corresponding to the previously identified masses on ultrasound. No associated architectural distortion or suspicious calcifications. No new or suspicious findings elsewhere. Targeted ultrasound is performed, again demonstrating the adjacent superficial circumscribed oval parallel mixed isoechoic and anechoic masses at 3 o'clock 8 cm from nipple. The larger mass measures approximately 1.1 x 0.7 x 1.1 cm and the smaller mass measures approximately 1.0 x 0.7 x 0.9 cm. These correspond to the fat-containing masses in the inner breast on mammography. A similar mixed anechoic and predominantly isoechoic mass  is again identified at the 9:30 o'clock position 6 cm from nipple measuring approximately 1.2 x 0.8 x 1.1 cm. This corresponds to the fat-containing mass in the upper-outer breast on mammography. No suspicious solid mass or abnormal acoustic shadowing is identified. LEFT: No findings suspicious malignancy. IMPRESSION: 1. No mammographic or sonographic evidence of malignancy involving the RIGHT breast. 2. No mammographic evidence of malignancy involving the LEFT breast. 3. Benign oil cysts involving the inner RIGHT breast and the upper-outer RIGHT breast. RECOMMENDATION: Screening mammogram at age 66 unless there are persistent or subsequent clinical concerns. (Code:SM-B-40A) I have discussed the findings and recommendations with the patient. If applicable, a reminder letter will be sent to the patient regarding the next appointment. BI-RADS CATEGORY  2: Benign. Electronically Signed   By: Hulan Saas M.D.   On: 12/06/2022 11:54  US BREAST LTD UNI RIGHT INC AXILLA  Result Date: 12/06/2022 CLINICAL DATA:  One year  interval follow-up of likely benign masses in the inner RIGHT breast at 3 o'clock 8 cm from the nipple and in the upper-outer RIGHT breast at 9:30 o'clock 6 cm from nipple. Annual evaluation, LEFT breast. These masses were initially palpated during breast feeding. She states no family history of breast cancer. EXAM: DIGITAL DIAGNOSTIC BILATERAL MAMMOGRAM WITH TOMOSYNTHESIS; ULTRASOUND RIGHT BREAST LIMITED TECHNIQUE: Bilateral digital diagnostic mammography and breast tomosynthesis was performed.; Targeted ultrasound examination of the right breast was performed COMPARISON:  Previous exam(s). ACR Breast Density Category b: There are scattered areas of fibroglandular density. FINDINGS: Full field CC and MLO views of both breasts were obtained. The overall density of both breasts has significantly decreased since the mammogram 1 year ago as expected with cessation of breastfeeding. RIGHT: Circumscribed  fat-containing mass superficially in the UPPER OUTER QUADRANT at middle depth measuring just over 1 cm in size, corresponding to the previously identified mass in the upper-outer breast on ultrasound. Developing dystrophic calcification involving the superolateral margin of the mass. No associated architectural distortion or suspicious calcifications. Adjacent circumscribed fat-containing masses superficially in the inner breast at anterior to middle depth measuring approximately 1.5 and 1 cm respectively, corresponding to the previously identified masses on ultrasound. No associated architectural distortion or suspicious calcifications. No new or suspicious findings elsewhere. Targeted ultrasound is performed, again demonstrating the adjacent superficial circumscribed oval parallel mixed isoechoic and anechoic masses at 3 o'clock 8 cm from nipple. The larger mass measures approximately 1.1 x 0.7 x 1.1 cm and the smaller mass measures approximately 1.0 x 0.7 x 0.9 cm. These correspond to the fat-containing masses in the inner breast on mammography. A similar mixed anechoic and predominantly isoechoic mass is again identified at the 9:30 o'clock position 6 cm from nipple measuring approximately 1.2 x 0.8 x 1.1 cm. This corresponds to the fat-containing mass in the upper-outer breast on mammography. No suspicious solid mass or abnormal acoustic shadowing is identified. LEFT: No findings suspicious malignancy. IMPRESSION: 1. No mammographic or sonographic evidence of malignancy involving the RIGHT breast. 2. No mammographic evidence of malignancy involving the LEFT breast. 3. Benign oil cysts involving the inner RIGHT breast and the upper-outer RIGHT breast. RECOMMENDATION: Screening mammogram at age 62 unless there are persistent or subsequent clinical concerns. (Code:SM-B-40A) I have discussed the findings and recommendations with the patient. If applicable, a reminder letter will be sent to the patient regarding the  next appointment. BI-RADS CATEGORY  2: Benign. Electronically Signed   By: Hulan Saas M.D.   On: 12/06/2022 11:54   Review of Systems:   A ROS was performed including pertinent positives and negatives as documented in the HPI.  Physical Exam :   Constitutional: NAD and appears stated age Neurological: Alert and oriented Psych: Appropriate affect and cooperative currently breastfeeding.   Comprehensive Musculoskeletal Exam:      Musculoskeletal Exam  Gait Normal  Alignment Normal   Right Left  Inspection Normal Normal  Palpation    Tenderness Patellar tendon and Pes anserine Patellar tendon and Pes anserine  Crepitus None None  Effusion None None  Range of Motion    Extension 0 0  Flexion 130 130  Strength    Extension 5/5 5/5  Flexion 5/5 5/5  Ligament Exam     Generalized Laxity No No  Lachman Negative Negative   Valgus at 0 Negative Negative  Valgus at 20 Negative Negative  Varus at 0 0 0  Varus at 20   0 0  Vascular/Lymphatic Exam  Edema None None  Venous Stasis Changes No No  Distal Circulation Normal Normal  Neurologic    Light Touch Sensation Intact Intact  Special Tests:      Imaging:   Xray (right knee 4 views, left knee 4 views): Negative for acute bony abnormality   I personally reviewed and interpreted the radiographs.   Assessment:   32 y.o. female with acute pain of bilateral knees.  She does have significant point tenderness over both patellar tendons and pes anserine bursa.  I suspect these were flared up with her workout last weekend after being out physical activity for a period of time.  After discussion of treatment options patient would like to proceed with anti-inflammatory therapy and rest for a few more weeks.  I will send her a prescription of Mobic to replace the ibuprofen for 1 week.  Discussed that if symptoms do not improve within the next 3 to 4 weeks that we can reevaluate and potentially get her in with physical therapy.   Discussed that she can also try knee straps for patellar tendinitis, however that it may or may not be of added benefit.  Plan :    -Follow-up in 3 weeks if symptoms have not improved     I personally saw and evaluated the patient, and participated in the management and treatment plan.  Hazle Nordmann, PA-C Orthopedics  This document was dictated using Conservation officer, historic buildings. A reasonable attempt at proof reading has been made to minimize errors.

## 2023-02-10 DIAGNOSIS — D485 Neoplasm of uncertain behavior of skin: Secondary | ICD-10-CM | POA: Diagnosis not present

## 2023-04-30 ENCOUNTER — Encounter (HOSPITAL_COMMUNITY): Payer: Self-pay

## 2023-04-30 ENCOUNTER — Other Ambulatory Visit (HOSPITAL_COMMUNITY): Payer: Self-pay

## 2023-07-06 ENCOUNTER — Ambulatory Visit
Admission: RE | Admit: 2023-07-06 | Discharge: 2023-07-06 | Disposition: A | Discharge status: Home / Self Care | Payer: BC Managed Care – PPO | Source: Ambulatory Visit | Attending: Family Medicine | Admitting: Family Medicine

## 2023-07-06 VITALS — BP 123/84 | HR 70 | Temp 98.4°F | Resp 18

## 2023-07-06 DIAGNOSIS — L03011 Cellulitis of right finger: Secondary | ICD-10-CM

## 2023-07-06 MED ORDER — DOXYCYCLINE HYCLATE 100 MG PO CAPS: 100.0000 mg | ORAL_CAPSULE | Freq: Two times a day (BID) | ORAL | 0 refills | Status: DC

## 2023-07-06 NOTE — ED Provider Notes (Signed)
[facility]    CSN: [id number] Arrival date & time: 07/06/23  9528      History   Chief Complaint No chief complaint on file.   HPI Renee Nelson is a 32 y.o. female.   Patient presenting today with 1 month history of pain, redness, swelling to the cuticle region of the right index finger.  Denies drainage, bleeding, known injury to the area, fever, chills, sweats.  Trying Epsom salt soaks, topical wound care with no relief.    Past Medical History:  Diagnosis Date   Gestational diabetes    managed by diet    Patient Active Problem List   Diagnosis Date Noted   Pregnancy 07/25/2021   Normal labor 04/02/2019    Past Surgical History:  Procedure Laterality Date   TONSILLECTOMY      OB History     Gravida  2   Para  2   Term  2   Preterm      AB      Living  2      SAB      IAB      Ectopic      Multiple  0   Live Births  2            Home Medications    Prior to Admission medications   Medication Sig Start Date End Date Taking? Authorizing Provider  doxycycline (VIBRAMYCIN) 100 MG capsule Take 1 capsule (100 mg total) by mouth 2 (two) times daily. 07/06/23  Yes Particia Nearing, PA-C  doxycycline (VIBRA-TABS) 100 MG tablet Take 1 tablet (100 mg total) by mouth 2 (two) times daily. 11/10/22   Junie Spencer, FNP  Norethindrone-Ethinyl Estradiol-Fe Biphas (LO LOESTRIN FE) 1 MG-10 MCG / 10 MCG tablet Take 1 tablet by mouth daily. 10/17/22     Prenatal Vit-Fe Fumarate-FA (PRENATAL MULTIVITAMIN) TABS tablet Take 1 tablet by mouth daily at 12 noon.    [provider]    Family History Family History  Problem Relation Age of Onset   CAD Paternal Grandmother    Diabetes Mellitus II Paternal Grandfather     Social History Social History   Tobacco Use   Smoking status: Never   Smokeless tobacco: Never  Vaping Use   Vaping status: Never Used  Substance Use Topics   Alcohol use: Never   Drug use: Never      Allergies   Penicillins   Review of Systems Review of Systems PER HPI  Physical Exam Triage Vital Signs ED Triage Vitals  Encounter Vitals Group     BP 07/06/23 0901 123/84     Systolic BP Percentile --      Diastolic BP Percentile --      Pulse Rate 07/06/23 0901 70     Resp 07/06/23 0901 18     Temp 07/06/23 0901 98.4 F (36.9 C)     Temp Source 07/06/23 0901 Oral     SpO2 07/06/23 0901 98 %     Weight --      Height --      Head Circumference --      Peak Flow --      Pain Score 07/06/23 0900 4     Pain Loc --      Pain Education --      Exclude from Growth Chart --    No data found.  Updated Vital Signs BP 123/84 (BP Location: Right Arm)   Pulse 70  Temp 98.4 F (36.9 C) (Oral)   Resp 18   LMP 06/18/2023 (Approximate)   SpO2 98%   Breastfeeding No   Visual Acuity Right Eye Distance:   Left Eye Distance:   Bilateral Distance:    Right Eye Near:   Left Eye Near:    Bilateral Near:     Physical Exam Vitals and nursing note reviewed.  Constitutional:      Appearance: Normal appearance. She is not ill-appearing.  HENT:     Head: Atraumatic.  Eyes:     Extraocular Movements: Extraocular movements intact.     Conjunctiva/sclera: Conjunctivae normal.  Cardiovascular:     Rate and Rhythm: Normal rate and regular rhythm.     Heart sounds: Normal heart sounds.  Pulmonary:     Effort: Pulmonary effort is normal.     Breath sounds: Normal breath sounds.  Musculoskeletal:        General: Normal range of motion.     Cervical back: Normal range of motion and neck supple.  Skin:    General: Skin is warm and dry.     Findings: Erythema present.     Comments: Area of erythema, edema to the cuticle of the right index finger.  Tender to palpation  Neurological:     Mental Status: She is alert and oriented to person, place, and time.     Motor: No weakness.     Gait: Gait normal.     Comments: Right hand neurovascularly intact  Psychiatric:         Mood and Affect: Mood normal.        Thought Content: Thought content normal.        Judgment: Judgment normal.      UC Treatments / Results  Labs (all labs ordered are listed, but only abnormal results are displayed) Labs Reviewed - No data to display  EKG   Radiology No results found.  Procedures Procedures (including critical care time)  Medications Ordered in UC Medications - No data to display  Initial Impression / Assessment and Plan / UC Course  I have reviewed the triage vital signs and the nursing notes.  Pertinent labs & imaging results that were available during my care of the patient were reviewed by me and considered in my medical decision making (see chart for details).     Treat with doxycycline for possible paronychia.  Discussed Epsom salt soaks, Neosporin, good home wound care.  Return for worsening symptoms.  Final Clinical Impressions(s) / UC Diagnoses   Final diagnoses:  Paronychia of finger, right     Discharge Instructions      I have prescribed a course of antibiotics for an ongoing possible infection of the fingertip.  You may also do warm Epsom salt soaks, Neosporin, keeping the area covered.  Follow-up with primary care provider if not resolving.    ED Prescriptions     Medication Sig Dispense Auth. Provider   doxycycline (VIBRAMYCIN) 100 MG capsule Take 1 capsule (100 mg total) by mouth 2 (two) times daily. 14 capsule Particia Nearing, [place]      PDMP not reviewed this encounter.   [place], [place], [place] 07/06/23 [phone]

## 2023-07-06 NOTE — Discharge Instructions (Signed)
I have prescribed a course of antibiotics for an ongoing possible infection of the fingertip.  You may also do warm Epsom salt soaks, Neosporin, keeping the area covered.  Follow-up with primary care provider if not resolving.

## 2023-07-06 NOTE — ED Triage Notes (Signed)
Pt reports her right index finger has been swollen x 1 month.   Unsure whether she injured finger

## 2023-10-09 ENCOUNTER — Other Ambulatory Visit (HOSPITAL_COMMUNITY): Payer: Self-pay

## 2023-10-09 MED ORDER — LO LOESTRIN FE 1 MG-10 MCG / 10 MCG PO TABS
1.0000 | ORAL_TABLET | Freq: Every day | ORAL | 0 refills | Status: DC
  Filled 2023-10-09: qty 84, 84d supply, fill #0

## 2023-10-21 ENCOUNTER — Other Ambulatory Visit (HOSPITAL_COMMUNITY): Payer: Self-pay

## 2023-10-30 ENCOUNTER — Encounter: Payer: Self-pay | Admitting: Physician Assistant

## 2023-11-19 ENCOUNTER — Telehealth: Admitting: Physician Assistant

## 2023-11-19 DIAGNOSIS — J019 Acute sinusitis, unspecified: Secondary | ICD-10-CM | POA: Diagnosis not present

## 2023-11-19 DIAGNOSIS — B9689 Other specified bacterial agents as the cause of diseases classified elsewhere: Secondary | ICD-10-CM | POA: Diagnosis not present

## 2023-11-19 MED ORDER — DOXYCYCLINE HYCLATE 100 MG PO TABS
100.0000 mg | ORAL_TABLET | Freq: Two times a day (BID) | ORAL | 0 refills | Status: DC
Start: 2023-11-19 — End: 2024-01-01

## 2023-11-19 NOTE — Progress Notes (Signed)
 I have spent 5 minutes in review of e-visit questionnaire, review and updating patient chart, medical decision making and response to patient.   Piedad Climes, PA-C

## 2023-11-19 NOTE — Progress Notes (Signed)

## 2024-01-01 ENCOUNTER — Other Ambulatory Visit (HOSPITAL_COMMUNITY): Payer: Self-pay

## 2024-01-01 ENCOUNTER — Encounter: Payer: Self-pay | Admitting: Physician Assistant

## 2024-01-01 ENCOUNTER — Ambulatory Visit: Payer: Self-pay | Admitting: Physician Assistant

## 2024-01-01 ENCOUNTER — Encounter: Payer: Self-pay | Admitting: Pharmacist

## 2024-01-01 VITALS — BP 110/60 | HR 65 | Ht 63.0 in | Wt 147.0 lb

## 2024-01-01 DIAGNOSIS — Z8379 Family history of other diseases of the digestive system: Secondary | ICD-10-CM

## 2024-01-01 DIAGNOSIS — R194 Change in bowel habit: Secondary | ICD-10-CM | POA: Diagnosis not present

## 2024-01-01 DIAGNOSIS — K602 Anal fissure, unspecified: Secondary | ICD-10-CM

## 2024-01-01 DIAGNOSIS — K625 Hemorrhage of anus and rectum: Secondary | ICD-10-CM

## 2024-01-01 MED ORDER — NA SULFATE-K SULFATE-MG SULF 17.5-3.13-1.6 GM/177ML PO SOLN
1.0000 | Freq: Once | ORAL | 0 refills | Status: AC
  Filled 2024-01-01: qty 354, 1d supply, fill #0

## 2024-01-01 MED ORDER — AMBULATORY NON FORMULARY MEDICATION: 1 refills | Status: DC

## 2024-01-01 NOTE — Progress Notes (Signed)
 Chief Complaint: Anal fissure  HPI:    Renee Nelson is a 33 year old female with a past medical history as listed below, who was referred to me by Renee Glasgow, DO for a complaint of anal fissure.      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  She has already talked to Renee Nelson about her symptoms.  Apparently her sister had a colonoscopy (she is younger than her) and was found to have UC and a precancerous polyp and her dad has lots of polyps.  She describes that she has chronic constipation off-and-on, worse with certain she thinks she eats as well as some gas, but most concerning to her is what she thinks is an anal fissure.  Apparently this started to be a problem a few months ago and would bleed and be painful and then be fine for a few weeks with sitz bath's and then she would get slightly constipated and would open back up.    Denies fever, chills, weight loss, abdominal pain, nausea, vomiting or diarrhea.  Past Medical History:  Diagnosis Date   Gestational diabetes    managed by diet    Past Surgical History:  Procedure Laterality Date   TONSILLECTOMY      Current Outpatient Medications  Medication Sig Dispense Refill   Norethindrone-Ethinyl Estradiol -Fe Biphas (LO LOESTRIN FE ) 1 MG-10 MCG / 10 MCG tablet Take 1 tablet by mouth daily. 84 tablet 0   Prenatal Vit-Fe Fumarate-FA (PRENATAL MULTIVITAMIN) TABS tablet Take 1 tablet by mouth daily at 12 noon.     No current facility-administered medications for this visit.    Allergies as of 01/01/2024 - Review Complete 01/01/2024  Allergen Reaction Noted   Penicillins Rash 04/02/2019    Family History  Problem Relation Age of Onset   Colon polyps Father    Ulcerative colitis Sister    Colon polyps Sister    CAD Paternal Grandmother    Diabetes Mellitus II Paternal Grandfather    Colon cancer Neg Hx    Esophageal cancer Neg Hx     Social History   Socioeconomic History    Marital status: Married    Spouse name: Renee Nelson   Number of children: 2   Years of education: Not on file   Highest education level: Not on file  Occupational History   Not on file  Tobacco Use   Smoking status: Never   Smokeless tobacco: Never  Vaping Use   Vaping status: Never Used  Substance and Sexual Activity   Alcohol use: Never   Drug use: Never   Sexual activity: Yes    Partners: Male    Birth control/protection: OCP  Other Topics Concern   Not on file  Social History Narrative   Not on file   Social Drivers of Health   Financial Resource Strain: Low Risk  (04/02/2019)   Overall Financial Resource Strain (CARDIA)    Difficulty of Paying Living Expenses: Not hard at all  Food Insecurity: No Food Insecurity (04/02/2019)   Hunger Vital Sign    Worried About Running Out of Food in the Last Year: Never true    Ran Out of Food in the Last Year: Never true  Transportation Needs: No Transportation Needs (04/02/2019)   PRAPARE - Administrator, Civil Service (Medical): No    Lack of Transportation (Non-Medical): No  Physical Activity: Sufficiently Active (04/02/2019)   Exercise Vital Sign  Days of Exercise per Week: 5 days    Minutes of Exercise per Session: 90 min  Stress: No Stress Concern Present (04/02/2019)   [employer] of Occupational Health - Occupational Stress Questionnaire    Feeling of Stress : Only a little  Social Connections: Unknown (12/03/2021)   Received from [facility], [facility]   Social Network    Social Network: Not on file  Intimate Partner Violence: Unknown (11/09/2021)   Received from [facility], [facility]   HITS    Physically Hurt: Not on file    Insult or Talk Down To: Not on file    Threaten Physical Harm: Not on file    Scream or Curse: Not on file    Review of Systems:    Constitutional: No weight loss, fever or chills Skin: No rash  Cardiovascular: No chest pain Respiratory: No SOB Gastrointestinal:  See HPI and otherwise negative Genitourinary: No dysuria  Neurological: No headache, dizziness or syncope Musculoskeletal: No new muscle or joint pain Hematologic: No bruising Psychiatric: No history of depression or anxiety   Physical Exam:  Vital signs: BP 110/60   Pulse 65   Ht 5\' 3"  (1.6 m)   Wt 147 lb (66.7 kg)   BMI 26.04 kg/m   Constitutional:   Pleasant Caucasian female appears to be in NAD, Well developed, Well nourished, alert and cooperative Head:  Normocephalic and atraumatic. Eyes:   PEERL, EOMI. No icterus. Conjunctiva pink. Ears:  Normal auditory acuity. Neck:  Supple Throat: Oral cavity and pharynx without inflammation, swelling or lesion.  Respiratory: Respirations even and unlabored. Lungs clear to auscultation bilaterally.   No wheezes, crackles, or rhonchi.  Cardiovascular: Normal S1, S2. No MRG. Regular rate and rhythm. No peripheral edema, cyanosis or pallor.  Gastrointestinal:  Soft, nondistended, nontender. No rebound or guarding. Normal bowel sounds. No appreciable masses or hepatomegaly. Rectal: External: Small anterior fissure, some tenderness to palpation, no bleeding, otherwise normal; internal: Normal sphincter tone some tenderness anteriorly Msk:  Symmetrical without gross deformities. Without edema, no deformity or joint abnormality.  Neurologic:  Alert and  oriented x4;  grossly normal neurologically.  Skin:   Dry and intact without significant lesions or rashes. Psychiatric: Demonstrates good judgement and reason without abnormal affect or behaviors.  RELEVANT LABS AND IMAGING: CBC    Component Value Date/Time   WBC 10.6 (H) 07/26/2021 0450   RBC 3.45 (L) 07/26/2021 0450   HGB 9.6 (L) 07/26/2021 0450   HCT 29.5 (L) 07/26/2021 0450   PLT 198 07/26/2021 0450   MCV 85.5 07/26/2021 0450   MCH 27.8 07/26/2021 0450   MCHC 32.5 07/26/2021 0450   RDW 13.0 07/26/2021 0450   Assessment: 1.  Anal fissure: Some bleeding, small seen at time of exam  today, seems to be somewhat chronic over the past couple of months but has never been treated correctly 2.  Family history of ulcerative colitis: In the patient's sister as well as polyps 3.  Change in bowel habits: Occasional constipation typically related to diet  Plan: 1.  Scheduled patient for diagnostic colonoscopy in the [facility] given family history as well as her own symptoms of change in bowel habits and rectal bleeding.  This is scheduled with Renee Nelson per patient's choice.  Did provide the patient a detailed list of risks for the procedure and she agrees to proceed. Patient is appropriate for endoscopic procedure(s) in the ambulatory ([facility]) setting.  2.  Prescribed Diltiazem 2% 3 times daily x 6  to 8 weeks applied to anal fissure 3.  Continue sitz bath's for 15 to 20 minutes 2-3 times a day 4.  Discussed avoiding constipation either with addition of MiraLAX or watching her diet 5.  Patient to follow in clinic per recommendations after time of procedure.  Reginal Capra, PA-C [facility] 01/01/2024, 2:40 PM  Cc: Renee Glasgow, DO

## 2024-01-01 NOTE — Patient Instructions (Addendum)
 We have sent a prescription for Diltiazem/ Lidocaine  gel to [facility]. Apply a pea size amount 1/2 to 1 inch inside rectum 3-4 times daily x1 month.  [facility]'s information is below: Address: [address] [place], [place] [address]  [phone]  *Please DO NOT go directly from our office to pick up this medication! Give the pharmacy 1 day to process the prescription as this is compounded and takes time to make.   You have been scheduled for a Colonoscopy. Please follow the written instructions given to you at your visit today.  If you use inhalers (even only as needed), please bring them with you on the day of your procedure.  DO NOT TAKE 7 DAYS PRIOR TO TEST- Trulicity (dulaglutide) Ozempic, Wegovy (semaglutide) Mounjaro (tirzepatide) Bydureon Bcise (exanatide extended release)  DO NOT TAKE 1 DAY PRIOR TO YOUR TEST Rybelsus (semaglutide) Adlyxin (lixisenatide) Victoza (liraglutide) Byetta (exanatide) ___________________________________________________________________________  Please follow up sooner if symptoms increase or worsen __________________________________________________________________________  Due to recent changes in healthcare laws, you may see the results of your imaging and laboratory studies on MyChart before your provider has had a chance to review them.  We understand that in some cases there may be results that are confusing or concerning to you. Not all laboratory results come back in the same time frame and the provider may be waiting for multiple results in order to interpret others.  Please give us  48 hours in order for your provider to thoroughly review all the results before contacting the office for clarification of your results.   Thank you for entrusting me with your care and choosing [facility].  Reginal Capra, PA-C _______________________________________________________  If your blood pressure at your visit  was 140/90 or greater, please contact your primary care physician to follow up on this.  _______________________________________________________  If you are age 89 or older, your body mass index should be between 23-30. Your Body mass index is 26.04 kg/m. If this is out of the aforementioned range listed, please consider follow up with your Primary Care Provider.  If you are age 68 or younger, your body mass index should be between 19-25. Your Body mass index is 26.04 kg/m. If this is out of the aformentioned range listed, please consider follow up with your Primary Care Provider.   ________________________________________________________  The [facility] providers would like to encourage you to use MYCHART to communicate with providers for non-urgent requests or questions.  Due to long hold times on the telephone, sending your provider a message by [facility] may be a faster and more efficient way to get a response.  Please allow 48 business hours for a response.  Please remember that this is for non-urgent requests.  _______________________________________________________

## 2024-01-02 ENCOUNTER — Other Ambulatory Visit (HOSPITAL_COMMUNITY): Payer: Self-pay

## 2024-01-14 ENCOUNTER — Other Ambulatory Visit (HOSPITAL_COMMUNITY): Payer: Self-pay

## 2024-02-09 ENCOUNTER — Telehealth: Payer: Self-pay | Admitting: *Deleted

## 2024-02-09 DIAGNOSIS — R194 Change in bowel habit: Secondary | ICD-10-CM

## 2024-02-09 DIAGNOSIS — K602 Anal fissure, unspecified: Secondary | ICD-10-CM

## 2024-02-09 DIAGNOSIS — K625 Hemorrhage of anus and rectum: Secondary | ICD-10-CM

## 2024-02-09 MED ORDER — NA SULFATE-K SULFATE-MG SULF 17.5-3.13-1.6 GM/177ML PO SOLN: 1.0000 | Freq: Once | ORAL | 0 refills | Status: AC

## 2024-02-09 NOTE — Telephone Encounter (Signed)
 Resent prep into pharmacy.  Pt made aware

## 2024-02-11 ENCOUNTER — Encounter: Payer: Self-pay | Admitting: Gastroenterology

## 2024-03-01 ENCOUNTER — Encounter: Payer: Self-pay | Admitting: Gastroenterology

## 2024-03-01 ENCOUNTER — Ambulatory Visit (AMBULATORY_SURGERY_CENTER): Admitting: Gastroenterology

## 2024-03-01 VITALS — BP 100/72 | HR 80 | Temp 98.1°F | Resp 22 | Ht 63.0 in | Wt 146.0 lb

## 2024-03-01 DIAGNOSIS — K5000 Crohn's disease of small intestine without complications: Secondary | ICD-10-CM

## 2024-03-01 DIAGNOSIS — K529 Noninfective gastroenteritis and colitis, unspecified: Secondary | ICD-10-CM

## 2024-03-01 DIAGNOSIS — K602 Anal fissure, unspecified: Secondary | ICD-10-CM

## 2024-03-01 DIAGNOSIS — K625 Hemorrhage of anus and rectum: Secondary | ICD-10-CM

## 2024-03-01 DIAGNOSIS — K648 Other hemorrhoids: Secondary | ICD-10-CM | POA: Diagnosis not present

## 2024-03-01 DIAGNOSIS — R194 Change in bowel habit: Secondary | ICD-10-CM

## 2024-03-01 DIAGNOSIS — K644 Residual hemorrhoidal skin tags: Secondary | ICD-10-CM

## 2024-03-01 MED ORDER — SODIUM CHLORIDE 0.9 % IV SOLN: 500.0000 mL | INTRAVENOUS | Status: DC

## 2024-03-01 NOTE — Progress Notes (Signed)
 [facility] History and Physical   Primary Care Physician:  Dannielle Bouchard, DO   Reason for Procedure:  Anal fissure, change in bowel habits, family history of IBD  Plan:    colonoscopy with possible interventions as needed     HPI: Renee Nelson is a very pleasant 33 y.o. female here for colonoscopy for evaluation of change in bowel habits, rule out IBD. H/o anal fissure and family h/o IBD.   The risks and benefits as well as alternatives of endoscopic procedure(s) have been discussed and reviewed. All questions answered. The patient agrees to proceed.    Past Medical History:  Diagnosis Date   Gestational diabetes    managed by diet    Past Surgical History:  Procedure Laterality Date   TONSILLECTOMY      Prior to Admission medications   Medication Sig Start Date End Date Taking? Authorizing Provider  AMBULATORY NON FORMULARY MEDICATION Medication Name: Diltiazem 2%/ Lidocaine  5% Apply pea size amount 1/2 to 1 inch inside rectum 3 times daily x2 months. 01/01/24  Yes Beather Delon Gibson, PA  Norethindrone-Ethinyl Estradiol -Fe Biphas (LO LOESTRIN FE ) 1 MG-10 MCG / 10 MCG tablet Take 1 tablet by mouth daily. 10/09/23  Yes   Prenatal Vit-Fe Fumarate-FA (PRENATAL MULTIVITAMIN) TABS tablet Take 1 tablet by mouth daily at 12 noon.   Yes [provider]    Current Outpatient Medications  Medication Sig Dispense Refill   AMBULATORY NON FORMULARY MEDICATION Medication Name: Diltiazem 2%/ Lidocaine  5% Apply pea size amount 1/2 to 1 inch inside rectum 3 times daily x2 months. 30 g 1   Norethindrone-Ethinyl Estradiol -Fe Biphas (LO LOESTRIN FE ) 1 MG-10 MCG / 10 MCG tablet Take 1 tablet by mouth daily. 84 tablet 0   Prenatal Vit-Fe Fumarate-FA (PRENATAL MULTIVITAMIN) TABS tablet Take 1 tablet by mouth daily at 12 noon.     Current Facility-Administered Medications  Medication Dose Route Frequency Provider Last Rate Last Admin   0.9 %  sodium chloride   infusion  500 mL Intravenous Continuous Shadoe Cryan V, MD        Allergies as of 03/01/2024 - Review Complete 03/01/2024  Allergen Reaction Noted   Penicillins Rash 04/02/2019    Family History  Problem Relation Age of Onset   Colon polyps Father    Ulcerative colitis Sister    Colon polyps Sister    CAD Paternal Grandmother    Diabetes Mellitus II Paternal Grandfather    Colon cancer Neg Hx    Esophageal cancer Neg Hx    Rectal cancer Neg Hx    Stomach cancer Neg Hx     Social History   Socioeconomic History   Marital status: Married    Spouse name: Massie   Number of children: 2   Years of education: Not on file   Highest education level: Not on file  Occupational History   Not on file  Tobacco Use   Smoking status: Never   Smokeless tobacco: Never  Vaping Use   Vaping status: Never Used  Substance and Sexual Activity   Alcohol use: Never   Drug use: Never   Sexual activity: Yes    Partners: Male    Birth control/protection: OCP  Other Topics Concern   Not on file  Social History Narrative   Not on file   Social Drivers of Health   Financial Resource Strain: Low Risk  (04/02/2019)   Overall Financial Resource Strain (CARDIA)    Difficulty of Paying Living Expenses: Not hard  at all  Food Insecurity: No Food Insecurity (04/02/2019)   Hunger Vital Sign    Worried About Running Out of Food in the Last Year: Never true    Ran Out of Food in the Last Year: Never true  Transportation Needs: No Transportation Needs (04/02/2019)   PRAPARE - Administrator, Civil Service (Medical): No    Lack of Transportation (Non-Medical): No  Physical Activity: Sufficiently Active (04/02/2019)   Exercise Vital Sign    Days of Exercise per Week: 5 days    Minutes of Exercise per Session: 90 min  Stress: No Stress Concern Present (04/02/2019)   [employer] of Occupational Health - Occupational Stress Questionnaire    Feeling of Stress : Only a little   Social Connections: Unknown (12/03/2021)   Received from [facility]   Social Network    Social Network: Not on file  Intimate Partner Violence: Unknown (11/09/2021)   Received from [facility]   HITS    Physically Hurt: Not on file    Insult or Talk Down To: Not on file    Threaten Physical Harm: Not on file    Scream or Curse: Not on file    Review of Systems:  All other review of systems negative except as mentioned in the HPI.  Physical Exam: Vital signs in last 24 hours: BP 130/81   Pulse 76   Temp 98.1 F (36.7 C) (Temporal)   Ht 5' 3 (1.6 m)   Wt 146 lb (66.2 kg)   LMP  (Approximate)   SpO2 98%   BMI 25.86 kg/m  General:   Alert, NAD Lungs:  Clear .   Heart:  Regular rate and rhythm Abdomen:  Soft, nontender and nondistended. Neuro/Psych:  Alert and cooperative. Normal mood and affect. A and O x 3  Reviewed labs, radiology imaging, old records and pertinent past GI work up  Patient is appropriate for planned procedure(s) and anesthesia in an ambulatory setting   K. Veena Teighan Aubert , MD [phone]

## 2024-03-01 NOTE — Progress Notes (Signed)
1420 Ephedrine 10 mg given IV due to low BP, MD updated.   

## 2024-03-01 NOTE — Op Note (Signed)
 [facility] Patient Name: Renee Nelson Procedure Date: 03/01/2024 2:02 PM MRN: [id number] Endoscopist: Gustav ALONSO Mcgee , MD, [id number] Age: 33 Referring MD:  Date of Birth: 1991/01/17 Gender: Female Account #: [id number] Procedure:                Colonoscopy Indications:              Suspected ulcerative colitis, Change in bowel                            habits, Rectal pain, Anal fissure, family history                            of ulcerative colitis Medicines:                Monitored Anesthesia Care Procedure:                Pre-Anesthesia Assessment:                           - Prior to the procedure, a History and Physical                            was performed, and patient medications and                            allergies were reviewed. The patient's tolerance of                            previous anesthesia was also reviewed. The risks                            and benefits of the procedure and the sedation                            options and risks were discussed with the patient.                            All questions were answered, and informed consent                            was obtained. Prior Anticoagulants: The patient has                            taken no anticoagulant or antiplatelet agents. ASA                            Grade Assessment: I - A normal, healthy patient.                            After reviewing the risks and benefits, the patient                            was deemed in satisfactory condition to undergo the  procedure.                           After obtaining informed consent, the colonoscope                            was passed under direct vision. Throughout the                            procedure, the patient's blood pressure, pulse, and                            oxygen saturations were monitored continuously. The                            Olympus Scope SN [id number] was introduced through  the                            anus and advanced to the the terminal ileum, with                            identification of the appendiceal orifice and IC                            valve. The colonoscopy was performed without                            difficulty. The patient tolerated the procedure                            well. The quality of the bowel preparation was                            good. The terminal ileum, ileocecal valve,                            appendiceal orifice, and rectum were photographed. Scope In: 2:15:08 PM Scope Out: 2:29:40 PM Scope Withdrawal Time: 0 hours 11 minutes 17 seconds  Total Procedure Duration: 0 hours 14 minutes 32 seconds  Findings:                 The perianal and digital rectal examinations were                            normal.                           Patchy mild inflammation characterized by                            congestion (edema), erythema and aphthous                            ulcerations was found in the terminal ileum.  Biopsies were taken with a cold forceps for                            histology.                           The exam was otherwise normal throughout the                            examined colon.                           Non-bleeding external and internal hemorrhoids were                            found during retroflexion. The hemorrhoids were                            medium-sized. Complications:            No immediate complications. Estimated Blood Loss:     Estimated blood loss was minimal. Impression:               - Mild inflammation was found in the ileum                            secondary to ileitis. Biopsied.                           - Non-bleeding external and internal hemorrhoids. Recommendation:           - Patient has a contact number available for                            emergencies. The signs and symptoms of potential                            delayed  complications were discussed with the                            patient. Return to normal activities tomorrow.                            Written discharge instructions were provided to the                            patient.                           - Resume previous diet.                           - Continue present medications.                           - Await pathology results.                           -  Repeat colonoscopy date to be determined after                            pending pathology results are reviewed for                            surveillance based on pathology results.                           - No ibuprofen , naproxen, or other non-steroidal                            anti-inflammatory drugs. Janeice Stegall V. Prestina Raigoza, MD 03/01/2024 2:39:58 PM This report has been signed electronically.

## 2024-03-01 NOTE — Patient Instructions (Signed)
Resume previous diet and medications. Awaiting pathology results. Repeat Colonoscopy date to be determined based on pathology results.  YOU HAD AN ENDOSCOPIC PROCEDURE TODAY AT THE [facility]:   Refer to the procedure report that was given to you for any specific questions about what was found during the examination.  If the procedure report does not answer your questions, please call your gastroenterologist to clarify.  If you requested that your care partner not be given the details of your procedure findings, then the procedure report has been included in a sealed envelope for you to review at your convenience later.  YOU SHOULD EXPECT: Some feelings of bloating in the abdomen. Passage of more gas than usual.  Walking can help get rid of the air that was put into your GI tract during the procedure and reduce the bloating. If you had a lower endoscopy (such as a colonoscopy or flexible sigmoidoscopy) you may notice spotting of blood in your stool or on the toilet paper. If you underwent a bowel prep for your procedure, you may not have a normal bowel movement for a few days.  Please Note:  You might notice some irritation and congestion in your nose or some drainage.  This is from the oxygen used during your procedure.  There is no need for concern and it should clear up in a day or so.  SYMPTOMS TO REPORT IMMEDIATELY:  Following lower endoscopy (colonoscopy or flexible sigmoidoscopy):  Excessive amounts of blood in the stool  Significant tenderness or worsening of abdominal pains  Swelling of the abdomen that is new, acute  Fever of 100F or higher   For urgent or emergent issues, a gastroenterologist can be reached at any hour by calling [phone]. Do not use MyChart messaging for urgent concerns.    DIET:  We do recommend a small meal at first, but then you may proceed to your regular diet.  Drink plenty of fluids but you should avoid alcoholic beverages for 24  hours.  ACTIVITY:  You should plan to take it easy for the rest of today and you should NOT DRIVE or use heavy machinery until tomorrow (because of the sedation medicines used during the test).    FOLLOW UP: Our staff will call the number listed on your records the next business day following your procedure.  We will call around 7:15- 8:00 am to check on you and address any questions or concerns that you may have regarding the information given to you following your procedure. If we do not reach you, we will leave a message.     If any biopsies were taken you will be contacted by phone or by letter within the next 1-3 weeks.  Please call us at [phone] if you have not heard about the biopsies in 3 weeks.    SIGNATURES/CONFIDENTIALITY: You and/or your care partner have signed paperwork which will be entered into your electronic medical record.  These signatures attest to the fact that that the information above on your After Visit Summary has been reviewed and is understood.  Full responsibility of the confidentiality of this discharge information lies with you and/or your care-partner. 

## 2024-03-01 NOTE — Progress Notes (Signed)
 Report given to PACU, vss

## 2024-03-01 NOTE — Progress Notes (Signed)
 Called to room to assist during endoscopic procedure.  Patient ID and intended procedure confirmed with present staff. Received instructions for my participation in the procedure from the performing physician.

## 2024-03-03 ENCOUNTER — Telehealth: Payer: Self-pay

## 2024-03-03 NOTE — Telephone Encounter (Signed)
 Left message

## 2024-03-04 LAB — SURGICAL PATHOLOGY

## 2024-05-23 ENCOUNTER — Telehealth: Admitting: Family

## 2024-05-23 DIAGNOSIS — H1031 Unspecified acute conjunctivitis, right eye: Secondary | ICD-10-CM | POA: Diagnosis not present

## 2024-05-23 MED ORDER — POLYMYXIN B-TRIMETHOPRIM 10000-0.1 UNIT/ML-% OP SOLN: 1.0000 [drp] | Freq: Four times a day (QID) | OPHTHALMIC | 0 refills | Status: DC

## 2024-05-23 NOTE — Progress Notes (Signed)
 We are sorry that you are not feeling well.  Here is how we plan to help!  Based on what you have shared with me it looks like you have conjunctivitis.  Conjunctivitis is a common inflammatory or infectious condition of the eye that is often referred to as pink eye.  In most cases it is contagious (viral or bacterial). However, not all conjunctivitis requires antibiotics (ex. Allergic).  We have made appropriate suggestions for you based upon your presentation.  I have prescribed Polytrim Ophthalmic drops 1-2 drops 4 times a day times 5 days  Pink eye can be highly contagious.  It is typically spread through direct contact with secretions, or contaminated objects or surfaces that one may have touched.  Strict handwashing is suggested with soap and water is urged.  If not available, use alcohol based had sanitizer.  Avoid unnecessary touching of the eye.  If you wear contact lenses, you will need to refrain from wearing them until you see no white discharge from the eye for at least 24 hours after being on medication.  You should see symptom improvement in 1-2 days after starting the medication regimen.  Call us  if symptoms are not improved in 1-2 days.  Home Care: Wash your hands often! Do not wear your contacts until you complete your treatment plan. Avoid sharing towels, bed linen, personal items with a person who has pink eye. See attention for anyone in your home with similar symptoms.  Get Help Right Away If: Your symptoms do not improve. You develop blurred or loss of vision. Your symptoms worsen (increased discharge, pain or redness)  Your e-visit answers were reviewed by a board certified advanced clinical practitioner to complete your personal care plan.  Depending on the condition, your plan could have included both over the counter or prescription medications.  If there is a problem please reply  once you have received a response from your provider.  Your safety is important to  us .  If you have drug allergies check your prescription carefully.    You can use MyChart to ask questions about today's visit, request a non-urgent call back, or ask for a work or school excuse for 24 hours related to this e-Visit. If it has been greater than 24 hours you will need to follow up with your provider, or enter a new e-Visit to address those concerns.   You will get an e-mail in the next two days asking about your experience.  I hope that your e-visit has been valuable and will speed your recovery. Thank you for using e-visits.  I have spent 5 minutes in review of e-visit questionnaire, review and updating patient chart, medical decision making and response to patient.   Bari Learn, FNP

## 2024-06-07 ENCOUNTER — Ambulatory Visit: Payer: Self-pay | Admitting: Gastroenterology

## 2024-07-31 ENCOUNTER — Telehealth: Admitting: Nurse Practitioner

## 2024-07-31 DIAGNOSIS — B9689 Other specified bacterial agents as the cause of diseases classified elsewhere: Secondary | ICD-10-CM

## 2024-07-31 DIAGNOSIS — J019 Acute sinusitis, unspecified: Secondary | ICD-10-CM

## 2024-07-31 MED ORDER — DOXYCYCLINE HYCLATE 100 MG PO TABS: 100.0000 mg | ORAL_TABLET | Freq: Two times a day (BID) | ORAL | 0 refills | Status: AC

## 2024-07-31 NOTE — Progress Notes (Signed)
 E-Visit for Sinus Problems  We are sorry that you are not feeling well.  Here is how we plan to help!  Based on what you have shared with me it looks like you have sinusitis.  Sinusitis is inflammation and infection in the sinus cavities of the head.  Based on your presentation I believe you most likely have Acute Bacterial Sinusitis.  This is an infection caused by bacteria and is treated with antibiotics. I have prescribed Doxycycline  100mg  by mouth twice a day for 7 days. You may use an oral decongestant such as Mucinex D or if you have glaucoma or high blood pressure use plain Mucinex. Saline nasal spray help and can safely be used as often as needed for congestion.  If you develop worsening sinus pain, fever or notice severe headache and vision changes, or if symptoms are not better after completion of antibiotic, please schedule an appointment with a health care provider.    Sinus infections are not as easily transmitted as other respiratory infection, however we still recommend that you avoid close contact with loved ones, especially the very young and elderly.  Remember to wash your hands thoroughly throughout the day as this is the number one way to prevent the spread of infection!  Home Care: Only take medications as instructed by your medical team. Complete the entire course of an antibiotic. Do not take these medications with alcohol. A steam or ultrasonic humidifier can help congestion.  You can place a towel over your head and breathe in the steam from hot water coming from a faucet. Avoid close contacts especially the very young and the elderly. Cover your mouth when you cough or sneeze. Always remember to wash your hands.  Get Help Right Away If: You develop worsening fever or sinus pain. You develop a severe head ache or visual changes. Your symptoms persist after you have completed your treatment plan.  Make sure you Understand these instructions. Will watch your  condition. Will get help right away if you are not doing well or get worse.  Your e-visit answers were reviewed by a board certified advanced clinical practitioner to complete your personal care plan.  Depending on the condition, your plan could have included both over the counter or prescription medications.  If there is a problem please reply  once you have received a response from your provider.  Your safety is important to us .  If you have drug allergies check your prescription carefully.    You can use MyChart to ask questions about today's visit, request a non-urgent call back, or ask for a work or school excuse for 24 hours related to this e-Visit. If it has been greater than 24 hours you will need to follow up with your provider, or enter a new e-Visit to address those concerns.  You will get an e-mail in the next two days asking about your experience.  I hope that your e-visit has been valuable and will speed your recovery. Thank you for using e-visits.  I have spent 5 minutes in review of e-visit questionnaire, review and updating patient chart, medical decision making and response to patient.   Jaylon Grode W Apolo Cutshaw, NP

## 2024-08-26 ENCOUNTER — Ambulatory Visit (HOSPITAL_BASED_OUTPATIENT_CLINIC_OR_DEPARTMENT_OTHER)

## 2024-08-26 ENCOUNTER — Ambulatory Visit (HOSPITAL_BASED_OUTPATIENT_CLINIC_OR_DEPARTMENT_OTHER): Admitting: Student

## 2024-08-26 DIAGNOSIS — M545 Low back pain, unspecified: Secondary | ICD-10-CM

## 2024-08-26 NOTE — Progress Notes (Signed)
 "                                Chief Complaint: Low back pain     History of Present Illness:    Renee Nelson is a 34 y.o. female who presents today for evaluation of pain in her low back.  She reports that this began in early December without any known cause.  Pain is over the midline of her low back and does not radiate into the lower extremities.  She denies any numbness or tingling.  Symptoms are worse while in a seated position and somewhat improved when standing.  She takes ibuprofen  as needed.  She does report history of mild scoliosis diagnosed in adolescence but denies any interventions needed for this.   Surgical History:   None  PMH/PSH/Family History/Social History/Meds/Allergies:    Past Medical History:  Diagnosis Date   Gestational diabetes    managed by diet   Past Surgical History:  Procedure Laterality Date   TONSILLECTOMY     Social History   Socioeconomic History   Marital status: Married    Spouse name: Massie   Number of children: 2   Years of education: Not on file   Highest education level: Not on file  Occupational History   Not on file  Tobacco Use   Smoking status: Never   Smokeless tobacco: Never  Vaping Use   Vaping status: Never Used  Substance and Sexual Activity   Alcohol use: Never   Drug use: Never   Sexual activity: Yes    Partners: Male    Birth control/protection: OCP  Other Topics Concern   Not on file  Social History Narrative   Not on file   Social Drivers of Health   Tobacco Use: Low Risk (03/01/2024)   Patient History    Smoking Tobacco Use: Never    Smokeless Tobacco Use: Never    Passive Exposure: Not on file  Financial Resource Strain: Not on file  Food Insecurity: Not on file  Transportation Needs: Not on file  Physical Activity: Not on file  Stress: Not on file  Social Connections: Unknown (12/03/2021)   Received from [facility]   Social Network    Social Network: Not on file  Depression  (PHQ2-9): Not on file  Alcohol Screen: Not on file  Housing: Not on file  Utilities: Not on file  Health Literacy: Not on file   Family History  Problem Relation Age of Onset   Colon polyps Father    Ulcerative colitis Sister    Colon polyps Sister    CAD Paternal Grandmother    Diabetes Mellitus II Paternal Grandfather    Colon cancer Neg Hx    Esophageal cancer Neg Hx    Rectal cancer Neg Hx    Stomach cancer Neg Hx    Allergies[1] Current Outpatient Medications  Medication Sig Dispense Refill   AMBULATORY NON FORMULARY MEDICATION Medication Name: Diltiazem 2%/ Lidocaine  5% Apply pea size amount 1/2 to 1 inch inside rectum 3 times daily x2 months. 30 g 1   Norethindrone-Ethinyl Estradiol -Fe Biphas (LO LOESTRIN FE ) 1 MG-10 MCG / 10 MCG tablet Take 1 tablet by mouth daily. 84 tablet 0   Prenatal Vit-Fe Fumarate-FA (PRENATAL MULTIVITAMIN) TABS tablet Take 1 tablet by mouth daily at 12 noon.     trimethoprim -polymyxin b  (POLYTRIM ) ophthalmic solution Place 1 drop into the right eye every 6 (six)  hours. 10 mL 0   No current facility-administered medications for this visit.   No results found.  Review of Systems:   A ROS was performed including pertinent positives and negatives as documented in the HPI.  Physical Exam :   Constitutional: NAD and appears stated age Neurological: Alert and oriented Psych: Appropriate affect and cooperative There were no vitals taken for this visit.   Comprehensive Musculoskeletal Exam:    Tenderness over the midline of the lower lumbar spine.  Bilateral SI joints nontender.  Full range of motion with lumbar flexion, extension, and bilateral rotation without discomfort.  Full fluid passive hip range of motion bilaterally without discomfort.  Mild posterior pain with FABER bilaterally, but negative FADIR and straight leg raise.  No lower extremity weakness noted.  Imaging:   Xray (lumbar spine 4 views): Mild inferior SI joint spurring.  Lumbar  intervertebral disc spacing is well-maintained with normal lordotic curvature.  There is some mild lower facet hypertrophy.   I personally reviewed and interpreted the radiographs.   Assessment:   34 y.o. female with approximately 72-month history of atraumatic low back pain.  This is located over the midline and she does not experience any radiation or paresthesias.  Symptoms tend to worsen with long periods of sitting.  She does have reported history of scoliosis although this does not appear significant on today's x-rays.  Discussed that particularly in the absence of neuropathic symptoms, treatments remain very conservative.  Discussed benefit of potential physical therapy for the low back with focus on core strengthening.  After consideration, patient will proceed with some activity modifications, symptomatic treatments, and work on core stabilization and will consider referral to physical therapy if needed.  Discussed that she can call or send a message if she would like a referral for this.  Would have her follow-up if symptoms worsen or continue to persist despite conservative measures.  Plan :    - Continue conservative management and consider addition of physical therapy     I personally saw and evaluated the patient, and participated in the management and treatment plan.  Leonce Reveal, PA-C Orthopedics    [1]  Allergies Allergen Reactions   Penicillins Rash   "

## 2024-09-10 ENCOUNTER — Telehealth: Admitting: Student

## 2024-09-10 DIAGNOSIS — B9689 Other specified bacterial agents as the cause of diseases classified elsewhere: Secondary | ICD-10-CM

## 2024-09-10 DIAGNOSIS — J029 Acute pharyngitis, unspecified: Secondary | ICD-10-CM

## 2024-09-10 MED ORDER — AZITHROMYCIN 250 MG PO TABS: ORAL_TABLET | ORAL | 0 refills | Status: AC

## 2024-09-10 NOTE — Progress Notes (Signed)
"      E-Visit for Sinus Problems  We are sorry that you are not feeling well.  Here is how we plan to help!  Based on what you have shared with me it looks like you have sinusitis.  Sinusitis is inflammation and infection in the sinus cavities of the head.  Based on your presentation I believe you most likely have Acute Bacterial Sinusitis.  This is an infection caused by bacteria and is treated with antibiotics. Because you are allergic to penicillin antibiotics, I have prescribed Azithromycin , which is an antibiotic that treats sinusitis and bacterial pharyngitis, like strep. You may use an oral decongestant such as Mucinex D or if you have glaucoma or high blood pressure use plain Mucinex. Saline nasal spray help and can safely be used as often as needed for congestion.  If you develop worsening sinus pain, fever or notice severe headache and vision changes, or if symptoms are not better after completion of antibiotic, please schedule an appointment with a health care provider.    Sinus infections are not as easily transmitted as other respiratory infection, however we still recommend that you avoid close contact with loved ones, especially the very [place] and elderly.  Remember to wash your hands thoroughly throughout the day as this is the number one way to prevent the spread of infection!  Home Care: Only take medications as instructed by your medical team. Complete the entire course of an antibiotic. Do not take these medications with alcohol. A steam or ultrasonic humidifier can help congestion.  You can place a towel over your head and breathe in the steam from hot water coming from a faucet. Avoid close contacts especially the very [place] and the elderly. Cover your mouth when you cough or sneeze. Always remember to wash your hands.  Get Help Right Away If: You develop worsening fever or sinus pain. You develop a severe head ache or visual changes. Your symptoms persist after you have  completed your treatment plan.  Make sure you Understand these instructions. Will watch your condition. Will get help right away if you are not doing well or get worse.  Your e-visit answers were reviewed by a board certified advanced clinical practitioner to complete your personal care plan.  Depending on the condition, your plan could have included both over the counter or prescription medications.  If there is a problem please reply  once you have received a response from your provider.  Your safety is important to us .  If you have drug allergies check your prescription carefully.    You can use MyChart to ask questions about todays visit, request a non-urgent call back, or ask for a work or school excuse for 24 hours related to this e-Visit. If it has been greater than 24 hours you will need to follow up with your provider, or enter a new e-Visit to address those concerns.  You will get an e-mail in the next two days asking about your experience.  I hope that your e-visit has been valuable and will speed your recovery. Thank you for using e-visits.  I have spent 7 minutes in review of e-visit questionnaire, review and updating patient chart, medical decision making and response to patient.   Leita FORBES Molly, PA-C     "
